# Patient Record
Sex: Male | Born: 1945 | Race: White | Hispanic: No | Marital: Married | Smoking: Never smoker
Health system: Southern US, Community
[De-identification: ages and names within clinical notes are randomized; demographics above are authoritative.]

## PROBLEM LIST (undated history)

## (undated) DIAGNOSIS — M67449 Ganglion, unspecified hand: Secondary | ICD-10-CM

## (undated) DIAGNOSIS — R519 Headache, unspecified: Secondary | ICD-10-CM

## (undated) DIAGNOSIS — I6381 Other cerebral infarction due to occlusion or stenosis of small artery: Secondary | ICD-10-CM

## (undated) DIAGNOSIS — K219 Gastro-esophageal reflux disease without esophagitis: Secondary | ICD-10-CM

## (undated) DIAGNOSIS — G2581 Restless legs syndrome: Secondary | ICD-10-CM

## (undated) DIAGNOSIS — Z8719 Personal history of other diseases of the digestive system: Secondary | ICD-10-CM

## (undated) DIAGNOSIS — I10 Essential (primary) hypertension: Secondary | ICD-10-CM

## (undated) DIAGNOSIS — E785 Hyperlipidemia, unspecified: Secondary | ICD-10-CM

## (undated) DIAGNOSIS — Z9049 Acquired absence of other specified parts of digestive tract: Secondary | ICD-10-CM

## (undated) HISTORY — DX: Other cerebral infarction due to occlusion or stenosis of small artery: I63.81

---

## 1992-07-07 HISTORY — PX: HERNIA REPAIR: SHX51

## 1997-07-07 HISTORY — PX: CHOLECYSTECTOMY: SHX55

## 1999-03-27 ENCOUNTER — Other Ambulatory Visit: Admission: RE | Admit: 1999-03-27 | Discharge: 1999-03-27 | Payer: Self-pay | Admitting: Urology

## 2002-07-28 ENCOUNTER — Ambulatory Visit (HOSPITAL_COMMUNITY): Admission: RE | Admit: 2002-07-28 | Discharge: 2002-07-28 | Payer: Self-pay | Admitting: Neurosurgery

## 2002-09-06 ENCOUNTER — Ambulatory Visit (HOSPITAL_COMMUNITY): Admission: RE | Admit: 2002-09-06 | Discharge: 2002-09-06 | Payer: Self-pay | Admitting: Neurosurgery

## 2012-03-07 HISTORY — PX: RETINAL DETACHMENT REPAIR W/ SCLERAL BUCKLE LE: SHX2338

## 2012-05-26 ENCOUNTER — Other Ambulatory Visit: Payer: Self-pay | Admitting: Orthopedic Surgery

## 2012-05-27 ENCOUNTER — Encounter (HOSPITAL_BASED_OUTPATIENT_CLINIC_OR_DEPARTMENT_OTHER): Payer: Self-pay | Admitting: *Deleted

## 2012-05-27 NOTE — Progress Notes (Signed)
NPO AFTER MN. ARRIVES AT 0830. NEEDS ISTAT AND EKG.  WILL TAKE PROTONIX AM OF SURG W/ SIP OF  WATER.

## 2012-05-30 NOTE — H&P (Signed)
Frederick Watkins is an 66 y.o. male.   Chief Complaint: L thumb mass HPI: This patient is a 66 year old RHD male who presents for evaluation of a left thumb dorsal mass that has been present since July.  In September, it was incised by a physician's assistant at his dermatologist.  It has recurred.  He has essentially no pain at the IP joint, but the bump is tender with direct pressure or if struck directly.  He reports that it did drain some clear fluid one time prior to the procedure.  Past Medical History  Diagnosis Date  . Hypertension   . Hyperlipidemia   . GERD (gastroesophageal reflux disease)   . RLS (restless legs syndrome)   . Mucous cyst of finger LEFT THUMB    Past Surgical History  Procedure Date  . Retinal detachment repair w/ scleral buckle le SEPT 2013    W/ CATARACT EXTRACTION WITH LENS IMPLANT , RIGHT EYE    History reviewed. No pertinent family history. Social History:  reports that he has never smoked. He has never used smokeless tobacco. He reports that he drinks about 4.2 ounces of alcohol per week. He reports that he does not use illicit drugs.  Allergies:  Allergies  Allergen Reactions  . Crestor (Rosuvastatin) Swelling    TONGUE SWELLS    No prescriptions prior to admission    No results found for this or any previous visit (from the past 48 hour(s)). No results found.  Review of Systems  All other systems reviewed and are negative.    Height 6' (1.829 m), weight 88.451 kg (195 lb). Physical Exam  Cardiovascular: Intact distal pulses.     Vitals: Refer to EMR. Constitutional:  WD, WN, NAD HEENT:  NCAT, EOMI Neuro/Psych:  Alert & oriented to person, place, and time; appropriate mood & affect Lymphatic: No generalized UE edema or lymphadenopathy Extremities / MSK:  Both UE are normal with respect to appearance, ranges of motion, joint stability, muscle strength/tone, sensation, & perfusion except as otherwise noted:  Left thumb has good motion  at the IP joint, with flexion to 65 with no pain.  There is mild grooving of the nail secondary to a characteristic-appearing mucous cyst on the ulnar-dorsal side of the thumb, measuring 5 mm in diameter.  The skin over the cyst is thin.  Assessment/Plan Assessment: Left thumb mucous cyst  Plan:  I discussed mucous cysts with the patient and the spectrum of treatment.  He has opted for this to be excised surgically.  I did discuss with him the concern that I have regarding how tight the closure might be given the size of the cyst and how this might require some immobilization of the thumb in the early stages of wound healing.  He would like to proceed at the outpatient surgery center after November 23.    The details of the operative procedure were discussed with the patient.  Questions were invited and answered.  In addition to the goal of the procedure, the risks of the procedure to include but not limited to bleeding; infection; damage to the nerves or blood vessels that could result in bleeding, numbness, weakness, chronic pain, and the need for additional procedures; stiffness; the need for revision surgery; and anesthetic risks, the worst of which is death, were reviewed.  No specific outcome was guaranteed or implied.  Informed consent was obtained.  Frederick Skowron A. 05/30/2012, 6:20 PM

## 2012-05-31 ENCOUNTER — Encounter (HOSPITAL_BASED_OUTPATIENT_CLINIC_OR_DEPARTMENT_OTHER): Payer: Self-pay | Admitting: Anesthesiology

## 2012-05-31 ENCOUNTER — Ambulatory Visit (HOSPITAL_BASED_OUTPATIENT_CLINIC_OR_DEPARTMENT_OTHER)
Admission: RE | Admit: 2012-05-31 | Discharge: 2012-05-31 | Disposition: A | Discharge status: Home / Self Care | Payer: Medicare PPO | Source: Ambulatory Visit | Attending: Orthopedic Surgery | Admitting: Orthopedic Surgery

## 2012-05-31 ENCOUNTER — Ambulatory Visit (HOSPITAL_BASED_OUTPATIENT_CLINIC_OR_DEPARTMENT_OTHER): Payer: Medicare PPO | Admitting: Anesthesiology

## 2012-05-31 ENCOUNTER — Encounter (HOSPITAL_BASED_OUTPATIENT_CLINIC_OR_DEPARTMENT_OTHER)
Admission: RE | Disposition: A | Discharge status: Home / Self Care | Payer: Self-pay | Source: Ambulatory Visit | Attending: Orthopedic Surgery

## 2012-05-31 ENCOUNTER — Encounter (HOSPITAL_BASED_OUTPATIENT_CLINIC_OR_DEPARTMENT_OTHER): Payer: Self-pay | Admitting: *Deleted

## 2012-05-31 DIAGNOSIS — I10 Essential (primary) hypertension: Secondary | ICD-10-CM | POA: Insufficient documentation

## 2012-05-31 DIAGNOSIS — K219 Gastro-esophageal reflux disease without esophagitis: Secondary | ICD-10-CM | POA: Insufficient documentation

## 2012-05-31 DIAGNOSIS — M6740 Ganglion, unspecified site: Secondary | ICD-10-CM | POA: Insufficient documentation

## 2012-05-31 DIAGNOSIS — E785 Hyperlipidemia, unspecified: Secondary | ICD-10-CM | POA: Insufficient documentation

## 2012-05-31 HISTORY — DX: Essential (primary) hypertension: I10

## 2012-05-31 HISTORY — PX: EAR CYST EXCISION: SHX22

## 2012-05-31 HISTORY — DX: Gastro-esophageal reflux disease without esophagitis: K21.9

## 2012-05-31 HISTORY — DX: Restless legs syndrome: G25.81

## 2012-05-31 HISTORY — DX: Hyperlipidemia, unspecified: E78.5

## 2012-05-31 HISTORY — DX: Ganglion, unspecified hand: M67.449

## 2012-05-31 LAB — POCT I-STAT 4, (NA,K, GLUC, HGB,HCT)
Glucose, Bld: 90 mg/dL (ref 70–99)
HCT: 47 % (ref 39.0–52.0)
Hemoglobin: 16 g/dL (ref 13.0–17.0)
Potassium: 4.3 mEq/L (ref 3.5–5.1)
Sodium: 139 mEq/L (ref 135–145)

## 2012-05-31 SURGERY — CYST REMOVAL
Anesthesia: General | Site: Thumb | Laterality: Left | Wound class: Clean

## 2012-05-31 MED ORDER — OXYCODONE HCL 5 MG/5ML PO SOLN
5.0000 mg | Freq: Once | ORAL | Status: DC | PRN
  Filled 2012-05-31: qty 5

## 2012-05-31 MED ORDER — MEPERIDINE HCL 25 MG/ML IJ SOLN
6.2500 mg | INTRAMUSCULAR | Status: DC | PRN
  Filled 2012-05-31: qty 1

## 2012-05-31 MED ORDER — PROMETHAZINE HCL 25 MG/ML IJ SOLN
6.2500 mg | INTRAMUSCULAR | Status: DC | PRN
  Filled 2012-05-31: qty 1

## 2012-05-31 MED ORDER — OXYCODONE-ACETAMINOPHEN 5-325 MG PO TABS
1.0000 | ORAL_TABLET | ORAL | Status: DC | PRN
  Filled 2012-05-31: qty 2

## 2012-05-31 MED ORDER — CEFAZOLIN SODIUM-DEXTROSE 2-3 GM-% IV SOLR
INTRAVENOUS | Status: DC | PRN
  Administered 2012-05-31: 2 g via INTRAVENOUS

## 2012-05-31 MED ORDER — BUPIVACAINE-EPINEPHRINE 0.5% -1:200000 IJ SOLN
INTRAMUSCULAR | Status: DC | PRN
  Administered 2012-05-31: 4 mL

## 2012-05-31 MED ORDER — LACTATED RINGERS IV SOLN
INTRAVENOUS | Status: DC
  Administered 2012-05-31: 10:00:00 via INTRAVENOUS
  Filled 2012-05-31: qty 1000

## 2012-05-31 MED ORDER — ONDANSETRON HCL 4 MG/2ML IJ SOLN
INTRAMUSCULAR | Status: DC | PRN
  Administered 2012-05-31: 4 mg via INTRAVENOUS

## 2012-05-31 MED ORDER — HYDROCODONE-ACETAMINOPHEN 5-325 MG PO TABS
1.0000 | ORAL_TABLET | ORAL | Status: DC | PRN
  Filled 2012-05-31: qty 2

## 2012-05-31 MED ORDER — LACTATED RINGERS IV SOLN
INTRAVENOUS | Status: DC | PRN
  Administered 2012-05-31: 10:00:00 via INTRAVENOUS

## 2012-05-31 MED ORDER — HYDROMORPHONE HCL PF 1 MG/ML IJ SOLN
0.5000 mg | INTRAMUSCULAR | Status: DC | PRN
  Filled 2012-05-31: qty 1

## 2012-05-31 MED ORDER — ACETAMINOPHEN 10 MG/ML IV SOLN
1000.0000 mg | Freq: Once | INTRAVENOUS | Status: DC | PRN
  Filled 2012-05-31: qty 100

## 2012-05-31 MED ORDER — HYDROMORPHONE HCL PF 1 MG/ML IJ SOLN
0.2500 mg | INTRAMUSCULAR | Status: DC | PRN
  Filled 2012-05-31: qty 1

## 2012-05-31 MED ORDER — FENTANYL CITRATE 0.05 MG/ML IJ SOLN
INTRAMUSCULAR | Status: DC | PRN
  Administered 2012-05-31 (×2): 25 ug via INTRAVENOUS

## 2012-05-31 MED ORDER — CEFAZOLIN SODIUM-DEXTROSE 2-3 GM-% IV SOLR
2.0000 g | INTRAVENOUS | Status: DC
  Filled 2012-05-31: qty 50

## 2012-05-31 MED ORDER — LACTATED RINGERS IV SOLN
INTRAVENOUS | Status: DC
  Filled 2012-05-31: qty 1000

## 2012-05-31 MED ORDER — OXYCODONE HCL 5 MG PO TABS
5.0000 mg | ORAL_TABLET | Freq: Once | ORAL | Status: DC | PRN
  Filled 2012-05-31: qty 1

## 2012-05-31 MED ORDER — METOCLOPRAMIDE HCL 5 MG/ML IJ SOLN
INTRAMUSCULAR | Status: DC | PRN
  Administered 2012-05-31: 10 mg via INTRAVENOUS

## 2012-05-31 MED ORDER — MIDAZOLAM HCL 5 MG/5ML IJ SOLN
INTRAMUSCULAR | Status: DC | PRN
  Administered 2012-05-31: 2 mg via INTRAVENOUS
  Administered 2012-05-31 (×2): 1 mg via INTRAVENOUS

## 2012-05-31 MED ORDER — CHLORHEXIDINE GLUCONATE 4 % EX LIQD
60.0000 mL | Freq: Once | CUTANEOUS | Status: DC
  Filled 2012-05-31: qty 60

## 2012-05-31 MED ORDER — LIDOCAINE HCL 2 % IJ SOLN
INTRAMUSCULAR | Status: DC | PRN
  Administered 2012-05-31: 4 mL

## 2012-05-31 MED ORDER — KETOROLAC TROMETHAMINE 30 MG/ML IJ SOLN
INTRAMUSCULAR | Status: DC | PRN
  Administered 2012-05-31: 30 mg via INTRAVENOUS

## 2012-05-31 SURGICAL SUPPLY — 48 items
BANDAGE CONFORM 2  STR LF (GAUZE/BANDAGES/DRESSINGS) IMPLANT
BANDAGE CONFORM 3  STR LF (GAUZE/BANDAGES/DRESSINGS) IMPLANT
BANDAGE GAUZE ELAST BULKY 4 IN (GAUZE/BANDAGES/DRESSINGS) IMPLANT
BLADE MINI RND TIP GREEN BEAV (BLADE) IMPLANT
BLADE SURG 15 STRL LF DISP TIS (BLADE) ×1 IMPLANT
BLADE SURG 15 STRL SS (BLADE) ×1
BNDG COHESIVE 1X5 TAN STRL LF (GAUZE/BANDAGES/DRESSINGS) ×2 IMPLANT
BNDG COHESIVE 4X5 TAN NS LF (GAUZE/BANDAGES/DRESSINGS) ×2 IMPLANT
BNDG ESMARK 4X9 LF (GAUZE/BANDAGES/DRESSINGS) IMPLANT
CANISTER SUCTION 1200CC (MISCELLANEOUS) IMPLANT
CANISTER SUCTION 2500CC (MISCELLANEOUS) IMPLANT
CHLORAPREP W/TINT 26ML (MISCELLANEOUS) ×2 IMPLANT
CLOTH BEACON ORANGE TIMEOUT ST (SAFETY) ×2 IMPLANT
CORDS BIPOLAR (ELECTRODE) ×2 IMPLANT
COVER MAYO STAND STRL (DRAPES) ×4 IMPLANT
COVER TABLE BACK 60X90 (DRAPES) ×2 IMPLANT
DEPRESSOR TONGUE BLADE STERILE (MISCELLANEOUS) ×2 IMPLANT
DRAPE EXTREMITY T 121X128X90 (DRAPE) ×2 IMPLANT
DRAPE SURG 17X23 STRL (DRAPES) ×2 IMPLANT
DRSG EMULSION OIL 3X3 NADH (GAUZE/BANDAGES/DRESSINGS) ×2 IMPLANT
ELECT NEEDLE BLADE 2-5/6 (NEEDLE) IMPLANT
GLOVE BIO SURGEON STRL SZ7.5 (GLOVE) ×2 IMPLANT
GLOVE INDICATOR 8.0 STRL GRN (GLOVE) ×4 IMPLANT
GOWN PREVENTION PLUS LG XLONG (DISPOSABLE) ×2 IMPLANT
GOWN PREVENTION PLUS XLARGE (GOWN DISPOSABLE) ×2 IMPLANT
NEEDLE HYPO 22GX1.5 SAFETY (NEEDLE) IMPLANT
NS IRRIG 500ML POUR BTL (IV SOLUTION) IMPLANT
PACK BASIN DAY SURGERY FS (CUSTOM PROCEDURE TRAY) ×2 IMPLANT
PADDING CAST ABS 3INX4YD NS (CAST SUPPLIES) ×1
PADDING CAST ABS 4INX4YD NS (CAST SUPPLIES) ×2
PADDING CAST ABS COTTON 3X4 (CAST SUPPLIES) ×1 IMPLANT
PADDING CAST ABS COTTON 4X4 ST (CAST SUPPLIES) ×2 IMPLANT
SLEEVE SCD COMPRESS KNEE MED (MISCELLANEOUS) IMPLANT
SLING ARM FOAM STRAP LRG (SOFTGOODS) IMPLANT
SLING ARM FOAM STRAP MED (SOFTGOODS) IMPLANT
SPLINT PLASTER CAST XFAST 3X15 (CAST SUPPLIES) IMPLANT
SPLINT PLASTER CAST XFAST 4X15 (CAST SUPPLIES) IMPLANT
SPLINT PLASTER XTRA FAST SET 4 (CAST SUPPLIES)
SPLINT PLASTER XTRA FASTSET 3X (CAST SUPPLIES)
SPONGE GAUZE 4X4 12PLY (GAUZE/BANDAGES/DRESSINGS) ×2 IMPLANT
SUT CHROMIC 6 0 PS 4 (SUTURE) IMPLANT
SUT ETHILON 4 0 PS 2 18 (SUTURE) IMPLANT
SUT VICRYL RAPID 5 0 P 3 (SUTURE) IMPLANT
SUT VICRYL RAPIDE 4/0 PS 2 (SUTURE) ×2 IMPLANT
SYRINGE 10CC LL (SYRINGE) ×2 IMPLANT
TUBE CONNECTING 12X1/4 (SUCTIONS) IMPLANT
UNDERPAD 30X30 INCONTINENT (UNDERPADS AND DIAPERS) ×2 IMPLANT
WATER STERILE IRR 500ML POUR (IV SOLUTION) ×2 IMPLANT

## 2012-05-31 NOTE — Anesthesia Procedure Notes (Signed)
Procedure Name: MAC Date/Time: 05/31/2012 10:17 AM Performed by: Jessica Priest Pre-anesthesia Checklist: Patient identified, Emergency Drugs available, Suction available, Patient being monitored and Timeout performed Patient Re-evaluated:Patient Re-evaluated prior to inductionOxygen Delivery Method: Nasal cannula Preoxygenation: Pre-oxygenation with 100% oxygen Comments: Nasal cannula O 2 on

## 2012-05-31 NOTE — Anesthesia Postprocedure Evaluation (Deleted)
Anesthesia Post Note  Patient: Frederick Watkins  Procedure(s) Performed: Procedure(s) (LRB): CYST REMOVAL (Left)  Anesthesia type: General  Patient location: PACU  Post pain: Pain level controlled  Post assessment: Post-op Vital signs reviewed  Last Vitals: BP 111/70  Pulse 66  Temp 36 C (Oral)  Resp 16  Ht 6' (1.829 m)  Wt 205 lb (92.987 kg)  BMI 27.80 kg/m2  SpO2 94%  Post vital signs: Reviewed  Level of consciousness: sedated  Complications: No apparent anesthesia complications

## 2012-05-31 NOTE — Transfer of Care (Signed)
Immediate Anesthesia Transfer of Care Note  Patient: Frederick Watkins  Procedure(s) Performed: Procedure(s) (LRB): CYST REMOVAL (Left)  Patient Location: PACU  Anesthesia Type: MAC  Level of Consciousness: awake, sedated, patient cooperative and responds to stimulation  Airway & Oxygen Therapy: Patient Spontanous Breathing and Patient RA no O2,  alert Post-op Assessment: Report given to PACU RN, Post -op Vital signs reviewed and stable and Patient moving all extremities  Post vital signs: Reviewed and stable  Complications: No apparent anesthesia complications

## 2012-05-31 NOTE — Anesthesia Postprocedure Evaluation (Signed)
Anesthesia Post Note  Patient: Frederick Watkins  Procedure(s) Performed: Procedure(s) (LRB): CYST REMOVAL (Left)  Anesthesia type: MAC  Patient location: PACU  Post pain: Pain level controlled  Post assessment: Post-op Vital signs reviewed  Last Vitals: BP 111/70  Pulse 66  Temp 36 C (Oral)  Resp 16  Ht 6' (1.829 m)  Wt 205 lb (92.987 kg)  BMI 27.80 kg/m2  SpO2 94%  Post vital signs: Reviewed  Level of consciousness: awake  Complications: No apparent anesthesia complications

## 2012-05-31 NOTE — Op Note (Signed)
05/31/2012  10:53 AM  PATIENT:  Frederick Watkins  66 y.o. male  PRE-OPERATIVE DIAGNOSIS:  Left thumb mucous cyst  POST-OPERATIVE DIAGNOSIS:  Same  PROCEDURE:  Excision left thumb mucous cyst  SURGEON: Cliffton Asters. Janee Morn, MD  PHYSICIAN ASSISTANT: None  ANESTHESIA:  Digital block/MAC  SPECIMENS:  To pathology  DRAINS:   None  PREOPERATIVE INDICATIONS:  Frederick Watkins is a  66 y.o. male with a diagnosis of left thumb mucous cyst who failed conservative measures and elected for surgical management.    The risks benefits and alternatives were discussed with the patient preoperatively including but not limited to the risks of infection, bleeding, nerve injury, cardiopulmonary complications, the need for revision surgery, among others, and the patient verbalized understanding and consented to proceed.  OPERATIVE IMPLANTS: None  OPERATIVE FINDINGS: Cystic structure with thin skin coverage overlying the terminal extensor tendon, no apparent connection to DIP joint.  OPERATIVE PROCEDURE:  After receiving prophylactic antibiotics, the patient was escorted to the operative theatre and placed in a supine position.  Digital block to the thumb was performed using Marcaine with epinephrine mixed with lidocaine. A surgical "time-out" was performed during which the planned procedure, proposed operative site, and the correct patient identity were compared to the operative consent and agreement confirmed by the circulating nurse according to current facility policy.  Following application of a tourniquet to the operative extremity, the exposed skin was prepped with Chloraprep and draped in the usual sterile fashion.  The limb was exsanguinated with an Esmarch bandage and the tourniquet inflated to approximately higher than systolic BP.  A Penrose drain was applied to the base of the thumb and clinical hemostat views of a tourniquet, and the pneumatic tourniquet was deflated  The lesion was marked  elliptically and excised using a Beaver blade. He did not appear to have an origin deep to the extensor tendon. The nail fold appeared not to be violated. Tourniquet was released, additional hemostasis obtained with bipolar cautery and the skin was closed with interrupted 4-0 nylon sutures. A dressing was applied with a tongue blade for a dorsal splint keeping the thumb in extension. He was taken to the recovery room in stable condition.    Disposition: Patient discharged home today with typical postop instructions, returning in 10-15 days for reassessment.  The sutures can be removed if the wound appears appropriate

## 2012-05-31 NOTE — Interval H&P Note (Signed)
History and Physical Interval Note:  05/31/2012 9:57 AM  Frederick Watkins  has presented today for surgery, with the diagnosis of left thumb mucous cyst  The various methods of treatment have been discussed with the patient and family. After consideration of risks, benefits and other options for treatment, the patient has consented to  Procedure(s) (LRB) with comments: CYST REMOVAL (Left) - Left Thumb Mucous Cyst Excision as a surgical intervention .  The patient's history has been reviewed, patient examined, no change in status, stable for surgery.  I have reviewed the patient's chart and labs.  Questions were answered to the patient's satisfaction.     Johnwilliam Shepperson A.

## 2012-05-31 NOTE — H&P (View-Only) (Signed)
NPO AFTER MN. ARRIVES AT 0830. NEEDS ISTAT AND EKG.  WILL TAKE PROTONIX AM OF SURG W/ SIP OF  WATER. 

## 2012-05-31 NOTE — Anesthesia Preprocedure Evaluation (Addendum)
Anesthesia Evaluation  Patient identified by MRN, date of birth, ID band Patient awake    Reviewed: Allergy & Precautions, H&P , NPO status , Patient's Chart, lab work & pertinent test results  Airway Mallampati: II TM Distance: >3 FB Neck ROM: Full    Dental  (+) Dental Advisory Given and Partial Upper   Pulmonary neg pulmonary ROS,  breath sounds clear to auscultation  Pulmonary exam normal       Cardiovascular hypertension, Pt. on medications Rhythm:Regular Rate:Normal     Neuro/Psych negative neurological ROS  negative psych ROS   GI/Hepatic negative GI ROS, Neg liver ROS, GERD-  Medicated,  Endo/Other  negative endocrine ROS  Renal/GU      Musculoskeletal negative musculoskeletal ROS (+)   Abdominal   Peds  Hematology negative hematology ROS (+)   Anesthesia Other Findings   Reproductive/Obstetrics                          Anesthesia Physical Anesthesia Plan  ASA: II  Anesthesia Plan: MAC   Post-op Pain Management:    Induction: Intravenous  Airway Management Planned: Nasal Cannula  Additional Equipment:   Intra-op Plan:   Post-operative Plan:   Informed Consent: I have reviewed the patients History and Physical, chart, labs and discussed the procedure including the risks, benefits and alternatives for the proposed anesthesia with the patient or authorized representative who has indicated his/her understanding and acceptance.   Dental advisory given  Plan Discussed with: CRNA  Anesthesia Plan Comments: (Pt had black coffee about 7am today. Discussed with Dr. Janee Morn options for anesthesia. Plan local with tourniquet. Pt accepts and will proceed.)       Anesthesia Quick Evaluation

## 2012-06-01 ENCOUNTER — Encounter (HOSPITAL_BASED_OUTPATIENT_CLINIC_OR_DEPARTMENT_OTHER): Payer: Self-pay | Admitting: Orthopedic Surgery

## 2013-09-04 ENCOUNTER — Emergency Department (HOSPITAL_COMMUNITY): Payer: Medicare Other

## 2013-09-04 ENCOUNTER — Emergency Department (HOSPITAL_COMMUNITY)
Admission: EM | Admit: 2013-09-04 | Discharge: 2013-09-04 | Disposition: A | Discharge status: Home / Self Care | Payer: Medicare Other | Attending: Emergency Medicine | Admitting: Emergency Medicine

## 2013-09-04 ENCOUNTER — Encounter (HOSPITAL_COMMUNITY): Payer: Self-pay | Admitting: Emergency Medicine

## 2013-09-04 DIAGNOSIS — Z79899 Other long term (current) drug therapy: Secondary | ICD-10-CM | POA: Insufficient documentation

## 2013-09-04 DIAGNOSIS — S2232XA Fracture of one rib, left side, initial encounter for closed fracture: Secondary | ICD-10-CM

## 2013-09-04 DIAGNOSIS — I10 Essential (primary) hypertension: Secondary | ICD-10-CM | POA: Insufficient documentation

## 2013-09-04 DIAGNOSIS — R296 Repeated falls: Secondary | ICD-10-CM | POA: Insufficient documentation

## 2013-09-04 DIAGNOSIS — Y939 Activity, unspecified: Secondary | ICD-10-CM | POA: Insufficient documentation

## 2013-09-04 DIAGNOSIS — R55 Syncope and collapse: Secondary | ICD-10-CM

## 2013-09-04 DIAGNOSIS — Z872 Personal history of diseases of the skin and subcutaneous tissue: Secondary | ICD-10-CM | POA: Insufficient documentation

## 2013-09-04 DIAGNOSIS — R112 Nausea with vomiting, unspecified: Secondary | ICD-10-CM | POA: Insufficient documentation

## 2013-09-04 DIAGNOSIS — S01501A Unspecified open wound of lip, initial encounter: Secondary | ICD-10-CM | POA: Insufficient documentation

## 2013-09-04 DIAGNOSIS — S2249XA Multiple fractures of ribs, unspecified side, initial encounter for closed fracture: Secondary | ICD-10-CM | POA: Insufficient documentation

## 2013-09-04 DIAGNOSIS — Y929 Unspecified place or not applicable: Secondary | ICD-10-CM | POA: Insufficient documentation

## 2013-09-04 DIAGNOSIS — Z7982 Long term (current) use of aspirin: Secondary | ICD-10-CM | POA: Insufficient documentation

## 2013-09-04 DIAGNOSIS — G2581 Restless legs syndrome: Secondary | ICD-10-CM | POA: Insufficient documentation

## 2013-09-04 DIAGNOSIS — S022XXA Fracture of nasal bones, initial encounter for closed fracture: Secondary | ICD-10-CM

## 2013-09-04 DIAGNOSIS — E785 Hyperlipidemia, unspecified: Secondary | ICD-10-CM | POA: Insufficient documentation

## 2013-09-04 DIAGNOSIS — Z8719 Personal history of other diseases of the digestive system: Secondary | ICD-10-CM | POA: Insufficient documentation

## 2013-09-04 DIAGNOSIS — Z23 Encounter for immunization: Secondary | ICD-10-CM | POA: Insufficient documentation

## 2013-09-04 LAB — CBC WITH DIFFERENTIAL/PLATELET
BASOS ABS: 0 10*3/uL (ref 0.0–0.1)
Basophils Relative: 0 % (ref 0–1)
EOS ABS: 0.1 10*3/uL (ref 0.0–0.7)
EOS PCT: 0 % (ref 0–5)
HCT: 44.3 % (ref 39.0–52.0)
Hemoglobin: 15.2 g/dL (ref 13.0–17.0)
Lymphocytes Relative: 13 % (ref 12–46)
Lymphs Abs: 1.8 10*3/uL (ref 0.7–4.0)
MCH: 30.5 pg (ref 26.0–34.0)
MCHC: 34.3 g/dL (ref 30.0–36.0)
MCV: 89 fL (ref 78.0–100.0)
MONO ABS: 1 10*3/uL (ref 0.1–1.0)
Monocytes Relative: 7 % (ref 3–12)
Neutro Abs: 11.1 10*3/uL — ABNORMAL HIGH (ref 1.7–7.7)
Neutrophils Relative %: 80 % — ABNORMAL HIGH (ref 43–77)
PLATELETS: 227 10*3/uL (ref 150–400)
RBC: 4.98 MIL/uL (ref 4.22–5.81)
RDW: 14.3 % (ref 11.5–15.5)
WBC: 14 10*3/uL — AB (ref 4.0–10.5)

## 2013-09-04 LAB — I-STAT TROPONIN, ED: TROPONIN I, POC: 0.01 ng/mL (ref 0.00–0.08)

## 2013-09-04 LAB — BASIC METABOLIC PANEL
BUN: 13 mg/dL (ref 6–23)
CALCIUM: 8.9 mg/dL (ref 8.4–10.5)
CO2: 26 mEq/L (ref 19–32)
CREATININE: 0.9 mg/dL (ref 0.50–1.35)
Chloride: 99 mEq/L (ref 96–112)
GFR calc Af Amer: >90 mL/min (ref >90)
GFR, EST NON AFRICAN AMERICAN: 85 mL/min — AB (ref >90)
Glucose, Bld: 144 mg/dL — ABNORMAL HIGH (ref 70–99)
Potassium: 4.3 mEq/L (ref 3.7–5.3)
SODIUM: 139 meq/L (ref 137–147)

## 2013-09-04 MED ORDER — OXYCODONE-ACETAMINOPHEN 5-325 MG PO TABS: 1.0000 | ORAL_TABLET | ORAL | Status: DC | PRN

## 2013-09-04 MED ORDER — FENTANYL CITRATE 0.05 MG/ML IJ SOLN
100.0000 ug | Freq: Once | INTRAMUSCULAR | Status: AC
  Administered 2013-09-04: 100 ug via INTRAVENOUS
  Filled 2013-09-04: qty 2

## 2013-09-04 MED ORDER — SODIUM CHLORIDE 0.9 % IV BOLUS (SEPSIS)
1000.0000 mL | Freq: Once | INTRAVENOUS | Status: AC
  Administered 2013-09-04: 1000 mL via INTRAVENOUS

## 2013-09-04 MED ORDER — FENTANYL CITRATE 0.05 MG/ML IJ SOLN
50.0000 ug | Freq: Once | INTRAMUSCULAR | Status: AC
  Administered 2013-09-04: 50 ug via INTRAVENOUS
  Filled 2013-09-04: qty 2

## 2013-09-04 MED ORDER — TETANUS-DIPHTH-ACELL PERTUSSIS 5-2.5-18.5 LF-MCG/0.5 IM SUSP
0.5000 mL | Freq: Once | INTRAMUSCULAR | Status: AC
  Administered 2013-09-04: 0.5 mL via INTRAMUSCULAR
  Filled 2013-09-04: qty 0.5

## 2013-09-04 NOTE — ED Notes (Signed)
Per EMS, patient was feeling nauseous and hot that woke him up from his sleep.  He got up to go to the bathroom, was dizzy and fell.  He hit his face on the commode with lacerations noted on right side of lip and inside upper lip.  He also complains of left flank pain on palpation.  Patient received 4mg  of zofran via EMS, and 13mL of normal saline through 20g in right A/C.  Nausea has subsided.  Blood pressure per EMS 136/76, heart rate of 68, RR 14.  Flank pain 7/10, and face pain 5/10.  Patient is from home.

## 2013-09-04 NOTE — ED Notes (Signed)
Patient transported to X-ray 

## 2013-09-04 NOTE — ED Notes (Signed)
Reported orthostatic findings to Dr. Christy Gentles.

## 2013-09-04 NOTE — ED Notes (Signed)
orthosatatic with standing pt.was very dizzie. Pt.has fall risk band is on right

## 2013-09-04 NOTE — ED Notes (Signed)
Patient returned to room. 

## 2013-09-04 NOTE — ED Notes (Signed)
Wickline MD at bedside  

## 2013-09-04 NOTE — ED Notes (Signed)
Respiratory therapist at the bedside teaching use of incentive spirometer.

## 2013-09-04 NOTE — ED Notes (Signed)
RT notified for incentive spirometer.  

## 2013-09-04 NOTE — ED Notes (Signed)
Patient discharged to home with wife. NAD.

## 2013-09-04 NOTE — ED Notes (Signed)
Suture cart is at the bedside.  

## 2013-09-04 NOTE — ED Provider Notes (Signed)
CSN: [id number]     Arrival date & time 09/04/13  0258 History   First MD Initiated Contact with Patient 09/04/13 [phone]     Chief Complaint  Patient presents with  . Fall      Patient is a 68 y.o. male presenting with syncope. The history is provided by the patient and the spouse.  Loss of Consciousness Episode history:  Single Most recent episode:  Today Duration:  1 minute Timing:  Constant Progression:  Improving Chronicity:  New Context: standing up   Witnessed: yes   Relieved by: lying down. Worsened by:  Posture Associated symptoms: vomiting   Associated symptoms: no chest pain, no confusion, no difficulty breathing, no fever, no focal weakness, no headaches, no seizures, no shortness of breath and no weakness   Pt reports he woke up to use restroom when he felt "warm" and dizzy.  He called out for wife who found him standing but was not speaking.  He then fell to floor.  No seizure reported.  Brief LOC reported.  He was back to baseline within one minute.  No proceeding cp/sob.  No HA  He sustained nasal/facial injury from fall.  He also injured his left chest wall He vomited once after the episode  No neck or back pain  He had otherwise been well but did not eat well prior to bed.  He is also on medications that can cause dizziness.    He denies h/o CAD/CHF   Past Medical History  Diagnosis Date  . Hypertension   . Hyperlipidemia   . GERD (gastroesophageal reflux disease)   . RLS (restless legs syndrome)   . Mucous cyst of finger LEFT THUMB   Past Surgical History  Procedure Laterality Date  . Retinal detachment repair w/ scleral buckle le  SEPT 2013    W/ CATARACT EXTRACTION WITH LENS IMPLANT , RIGHT EYE  . Ear cyst excision  05/31/2012    Procedure: CYST REMOVAL;  Surgeon: [name], MD;  Location: [facility];  Service: Orthopedics;  Laterality: Left;  Left Thumb Mucous Cyst Excision   History reviewed. No pertinent family  history. History  Substance Use Topics  . Smoking status: Never Smoker   . Smokeless tobacco: Never Used  . Alcohol Use: 4.2 oz/week    7 Glasses of wine per week    Review of Systems  Constitutional: Negative for fever.  Respiratory: Negative for shortness of breath.   Cardiovascular: Positive for syncope. Negative for chest pain.  Gastrointestinal: Positive for vomiting. Negative for abdominal pain.  Musculoskeletal: Negative for back pain and neck pain.  Neurological: Positive for syncope. Negative for focal weakness, seizures, weakness and headaches.  Psychiatric/Behavioral: Negative for confusion.  All other systems reviewed and are negative.      Allergies  Crestor  Home Medications   Current Outpatient Rx  Name  Route  Sig  Dispense  Refill  . aspirin EC 325 MG tablet   Oral   Take 325 mg by mouth daily.         . citalopram (CELEXA) 20 MG tablet   Oral   Take 20 mg by mouth every evening.         Marland Kitchen lisinopril (PRINIVIL,ZESTRIL) 20 MG tablet   Oral   Take 20 mg by mouth every evening.         Marland Kitchen LORazepam (ATIVAN) 0.5 MG tablet   Oral   Take 0.5 mg by mouth every 8 (eight) hours as  needed.         . Omega-3 Fatty Acids (FISH OIL) 1000 MG CAPS   Oral   Take 1 capsule by mouth daily.         Marland Kitchen rOPINIRole (REQUIP) 1 MG tablet   Oral   Take 1 mg by mouth at bedtime.         . simvastatin (ZOCOR) 40 MG tablet   Oral   Take 40 mg by mouth every evening.          BP 109/66  Pulse 70  Temp(Src) 97.9 F (36.6 C)  Resp 15  Wt 205 lb (92.987 kg)  SpO2 98% BP 113/67  Pulse 74  Temp(Src) 97.9 F (36.6 C)  Resp 14  Wt 205 lb (92.987 kg)  SpO2 99%  Physical Exam CONSTITUTIONAL: Well developed/well nourished HEAD: Normocephalic/atraumatic EYES: EOMI/PERRL ENMT: Mucous membranes moist. Nasal tenderness.  No septal hematoma.  Blood noted at nares.  Laceration above right lip.  Small laceration noted to inner mucosa.  No dental injury.   No malocclusion.  NECK: supple no meningeal signs SPINE:entire spine nontender, No bruising/crepitance/stepoffs noted to spine NEXUS criteria met CV: S1/S2 noted, no murmurs/rubs/gallops noted LUNGS: Lungs are clear to auscultation bilaterally, no apparent distress Chest - tenderness along left costal margin.  No crepitus is noted ABDOMEN: soft, nontender, no rebound or guarding, no bruising is noted GU:no cva tenderness, no bruising is noted NEURO: Pt is awake/alert, moves all extremitiesx4, no arm/leg drift is noted, no facial droop is noted, GCS 15 EXTREMITIES: pulses normal, full ROM, All extremities/joints palpated/ranged and nontender SKIN: warm, color normal PSYCH: no abnormalities of mood noted  ED Course  Procedures   LACERATION REPAIR Performed by: Sharyon Cable Consent: Verbal consent obtained. Risks and benefits: risks, benefits and alternatives were discussed Patient identity confirmed: provided demographic data Time out performed prior to procedure Prepped and Draped in normal sterile fashion Wound explored Laceration Location:just superior to right lip Laceration Length: 1cm No Foreign Bodies seen or palpated Anesthesia: local infiltration Local anesthetic: lidocaine 1%  epinephrine Anesthetic total: 3 ml Amount of cleaning: standard Skin closure: simple Number of sutures or staples: 2 vicryl Technique: simple Patient tolerance: Patient tolerated the procedure well with no immediate complications.  5:30 AM Pt with syncopal episode after standing.  He has positive orthostatic vital signs here.  Suspect this could have been caused by position change, decreased PO and also his medications Pt currently stable 6:43 AM D/w ENT dr byers, no antibiotics for nasal fx, f/u in 5 days Pt feels improved, ambulatory and no dizziness reported  He would like to go home   Pt still insisting on d/c home He is not hypoxic or tachypneic His CP is controlled  For  syncope - suspect vasovagal episode.  I doubt this represents cardiac dysrhythmia or acute neurologic emergency. Pt is now improved and ambulatory and he responded to IV fluids  For nasal fx- will f/u with ENT (he has seen [facility]) in 4 days.    For rib fractures - I advised liberal use of pain meds, incentive spirometer.  I offered admission for pain control but he wants to go home.  I advised close PCP followup in 1-2 days for recheck. We discussed strict precautions including SOB/hemoptysis/increased chest pain  Definitive fracture care performed for the left rib fractures  This included analgesia in the ED, incentive spiromoterly,  outpatient prescriptions for ongoing pain control as outpatient which have been provided.  I have counseled  the pt on possible complications of the fractures and signs and symptoms which would mandate return for further care.  They have expressed their understanding.   Labs Review Labs Reviewed  CBC WITH DIFFERENTIAL - Abnormal; Notable for the following:    WBC 14.0 (*)    Neutrophils Relative % 80 (*)    Neutro Abs 11.1 (*)    All other components within normal limits  BASIC METABOLIC PANEL - Abnormal; Notable for the following:    Glucose, Bld 144 (*)    GFR calc non Af Amer 85 (*)    All other components within normal limits  I-STAT TROPOININ, ED   Imaging Review Dg Ribs Unilateral W/chest Left  09/04/2013   CLINICAL DATA:  Pain, dizziness, fall with left anterior rib pain.  EXAM: LEFT RIBS AND CHEST - 3+ VIEW  COMPARISON:  None available for comparison at time of study interpretation.  FINDINGS: Left anterolateral sixth through rib fractures are slightly displaced. Left posterior seventh, eighth and possibly ninth displaced rib fractures.  Strandy densities in left lung base with trace left pleural effusion. No pneumothorax. The right lung is clear. Cardio mediastinal silhouette is nonsuspicious for this low inspiratory examination. Surgical clips in  the right abdomen may reflect cholecystectomy.  IMPRESSION: Minimally displaced collapse anterior lateral sixth through eighth rib fractures. Left posterior seventh, eighth and possibly ninth minimally displaced rib fractures.  Strandy densities in left lung base may reflect atelectasis or even pulmonary contusion with small left pleural effusion versus hemothorax, no pneumothorax.   Electronically Signed   By: Elon Alas   On: 09/04/2013 05:28   Ct Head Wo Contrast  09/04/2013   CLINICAL DATA:  Status post fall; hit face on commode. Lacerations at the lips and dried blood about the nares. Nausea.  EXAM: CT HEAD WITHOUT CONTRAST  CT MAXILLOFACIAL WITHOUT CONTRAST  TECHNIQUE: Multidetector CT imaging of the head and maxillofacial structures were performed using the standard protocol without intravenous contrast. Multiplanar CT image reconstructions of the maxillofacial structures were also generated.  COMPARISON:  None.  FINDINGS: CT HEAD FINDINGS  There is no evidence of acute infarction, mass lesion, or intra- or extra-axial hemorrhage on CT.  Prominence of the sulci suggests mild cortical volume loss. 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 Mild cerebellar atrophy is noted.  The brainstem and fourth ventricle are within normal limits. The cerebral hemispheres demonstrate grossly normal gray-white differentiation. No mass effect or midline shift is seen.  There is a significantly comminuted fracture involving both sides of the nasal bone, with leftward displacement. A superior fragment demonstrates mild superior displacement. Scattered associated foci of soft tissue air are seen, with soft tissue swelling. The visualized portions of the orbits are within normal limits. The paranasal sinuses and mastoid air cells are well-aerated. No additional soft tissue abnormalities are seen.  CT MAXILLOFACIAL FINDINGS  There is significantly comminuted fracture involving both sides of the nasal  bone, with leftward displacement. A superior fragment demonstrates mild superior displacement. No additional fractures are seen. The maxilla and mandible appear intact. The visualized dentition demonstrates no acute abnormality.  The orbits are intact bilaterally. The visualized paranasal sinuses and mastoid air cells are well-aerated.  Soft tissue swelling is noted about the nose; known soft tissue lacerations at the lips are not well characterized on CT. The parapharyngeal fat planes are preserved. The nasopharynx, oropharynx and hypopharynx are unremarkable in appearance. The visualized portions of the valleculae and piriform sinuses are  grossly unremarkable.  The parotid and submandibular glands are within normal limits. No cervical lymphadenopathy is seen.  IMPRESSION: 1. No evidence of traumatic intracranial injury. 2. Significantly comminuted fracture involving both sides of the nasal bone, with leftward displacement. A superior fragment of the nasal bone demonstrates mild superior displacement. Associated soft tissue swelling and scattered foci of soft tissue air seen. 3. 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   Electronically Signed   By: Garald Balding M.D.   On: 09/04/2013 05:46   Ct Maxillofacial Wo Cm  09/04/2013   CLINICAL DATA:  Status post fall; hit face on commode. Lacerations at the lips and dried blood about the nares. Nausea.  EXAM: CT HEAD WITHOUT CONTRAST  CT MAXILLOFACIAL WITHOUT CONTRAST  TECHNIQUE: Multidetector CT imaging of the head and maxillofacial structures were performed using the standard protocol without intravenous contrast. Multiplanar CT image reconstructions of the maxillofacial structures were also generated.  COMPARISON:  None.  FINDINGS: CT HEAD FINDINGS  There is no evidence of acute infarction, mass lesion, or intra- or extra-axial hemorrhage on CT.  Prominence of the sulci suggests mild cortical volume loss. A chronic lacunar  infarct is noted at the left basal ganglia. Mild cerebellar atrophy is noted.  The brainstem and fourth ventricle are within normal limits. The cerebral hemispheres demonstrate grossly normal gray-white differentiation. No mass effect or midline shift is seen.  There is a significantly comminuted fracture involving both sides of the nasal bone, with leftward displacement. A superior fragment demonstrates mild superior displacement. Scattered associated foci of soft tissue air are seen, with soft tissue swelling. The visualized portions of the orbits are within normal limits. The paranasal sinuses and mastoid air cells are well-aerated. No additional soft tissue abnormalities are seen.  CT MAXILLOFACIAL FINDINGS  There is significantly comminuted fracture involving both sides of the nasal bone, with leftward displacement. A superior fragment demonstrates mild superior displacement. No additional fractures are seen. The maxilla and mandible appear intact. The visualized dentition demonstrates no acute abnormality.  The orbits are intact bilaterally. The visualized paranasal sinuses and mastoid air cells are well-aerated.  Soft tissue swelling is noted about the nose; known soft tissue lacerations at the lips are not well characterized on CT. The parapharyngeal fat planes are preserved. The nasopharynx, oropharynx and hypopharynx are unremarkable in appearance. The visualized portions of the valleculae and piriform sinuses are grossly unremarkable.  The parotid and submandibular glands are within normal limits. No cervical lymphadenopathy is seen.  IMPRESSION: 1. No evidence of traumatic intracranial injury. 2. Significantly comminuted fracture involving both sides of the nasal bone, with leftward displacement. A superior fragment of the nasal bone demonstrates mild superior displacement. Associated soft tissue swelling and scattered foci of soft tissue air seen. 3. Mild cortical volume loss; chronic lacunar infarct at  the left basal ganglia.   Electronically Signed   By: Garald Balding M.D.   On: 09/04/2013 05:46     EKG Interpretation   Date/Time:  Sunday September 04 2013 03:13:42 EST Ventricular Rate:  63 PR Interval:  164 QRS Duration: 158 QT Interval:  462 QTC Calculation: 473 R Axis:   60 Text Interpretation:  Sinus rhythm Right bundle branch block Baseline  wander in lead(s) V4 Confirmed by Christy Gentles  MD, Grahm Etsitty ([id number]) on 09/04/2013  3:18:43 AM      MDM   Final diagnoses:  Syncope  Nasal fracture  Closed fracture of rib of left side  Nursing notes including past medical history and social history reviewed and considered in documentation Labs/vital reviewed and considered xrays reviewed and considered     Sharyon Cable, MD 09/04/13 0800

## 2014-10-27 ENCOUNTER — Ambulatory Visit
Admission: RE | Admit: 2014-10-27 | Discharge: 2014-10-27 | Disposition: A | Discharge status: Home / Self Care | Payer: 59 | Source: Ambulatory Visit | Attending: Otolaryngology | Admitting: Otolaryngology

## 2014-10-27 ENCOUNTER — Other Ambulatory Visit: Payer: Self-pay | Admitting: Otolaryngology

## 2014-10-27 DIAGNOSIS — R05 Cough: Secondary | ICD-10-CM

## 2014-10-27 DIAGNOSIS — R059 Cough, unspecified: Secondary | ICD-10-CM

## 2014-10-30 ENCOUNTER — Other Ambulatory Visit: Payer: Self-pay | Admitting: Otolaryngology

## 2014-10-30 DIAGNOSIS — J984 Other disorders of lung: Secondary | ICD-10-CM

## 2014-11-07 ENCOUNTER — Other Ambulatory Visit: Payer: 59

## 2014-11-09 ENCOUNTER — Ambulatory Visit
Admission: RE | Admit: 2014-11-09 | Discharge: 2014-11-09 | Disposition: A | Discharge status: Home / Self Care | Payer: 59 | Source: Ambulatory Visit | Attending: Otolaryngology | Admitting: Otolaryngology

## 2014-11-09 DIAGNOSIS — J984 Other disorders of lung: Secondary | ICD-10-CM

## 2014-11-09 MED ORDER — IOPAMIDOL (ISOVUE-300) INJECTION 61%
75.0000 mL | Freq: Once | INTRAVENOUS | Status: AC | PRN
  Administered 2014-11-09: 75 mL via INTRAVENOUS

## 2015-11-21 ENCOUNTER — Other Ambulatory Visit: Payer: Self-pay | Admitting: Family Medicine

## 2015-11-21 DIAGNOSIS — R911 Solitary pulmonary nodule: Secondary | ICD-10-CM

## 2015-11-28 ENCOUNTER — Ambulatory Visit
Admission: RE | Admit: 2015-11-28 | Discharge: 2015-11-28 | Disposition: A | Discharge status: Home / Self Care | Payer: Medicare Other | Source: Ambulatory Visit | Attending: Family Medicine | Admitting: Family Medicine

## 2015-11-28 DIAGNOSIS — R911 Solitary pulmonary nodule: Secondary | ICD-10-CM

## 2018-04-30 ENCOUNTER — Other Ambulatory Visit (HOSPITAL_COMMUNITY): Payer: Self-pay | Admitting: Otolaryngology

## 2018-04-30 DIAGNOSIS — J410 Simple chronic bronchitis: Secondary | ICD-10-CM

## 2018-04-30 DIAGNOSIS — R911 Solitary pulmonary nodule: Secondary | ICD-10-CM

## 2018-05-04 ENCOUNTER — Ambulatory Visit (HOSPITAL_COMMUNITY)
Admission: RE | Admit: 2018-05-04 | Discharge: 2018-05-04 | Disposition: A | Discharge status: Home / Self Care | Payer: Medicare Other | Source: Ambulatory Visit | Attending: Otolaryngology | Admitting: Otolaryngology

## 2018-05-04 ENCOUNTER — Encounter (HOSPITAL_COMMUNITY): Payer: Self-pay

## 2018-05-04 DIAGNOSIS — R911 Solitary pulmonary nodule: Secondary | ICD-10-CM | POA: Diagnosis not present

## 2018-05-04 DIAGNOSIS — J410 Simple chronic bronchitis: Secondary | ICD-10-CM

## 2018-05-04 DIAGNOSIS — I700 Atherosclerosis of aorta: Secondary | ICD-10-CM | POA: Diagnosis not present

## 2018-05-04 LAB — POCT I-STAT CREATININE: Creatinine, Ser: 1 mg/dL (ref 0.61–1.24)

## 2018-05-04 MED ORDER — IOHEXOL 300 MG/ML  SOLN
75.0000 mL | Freq: Once | INTRAMUSCULAR | Status: AC | PRN
  Administered 2018-05-04: 75 mL via INTRAVENOUS

## 2018-05-04 MED ORDER — SODIUM CHLORIDE 0.9 % IJ SOLN
INTRAMUSCULAR | Status: AC
  Filled 2018-05-04: qty 50

## 2018-12-02 ENCOUNTER — Telehealth: Payer: Self-pay

## 2018-12-02 NOTE — Telephone Encounter (Signed)
YOUR CARDIOLOGY TEAM HAS ARRANGED FOR AN E-VISIT FOR YOUR APPOINTMENT - PLEASE REVIEW IMPORTANT INFORMATION BELOW SEVERAL DAYS PRIOR TO YOUR APPOINTMENT  Due to the recent COVID-19 pandemic, we are transitioning in-person office visits to tele-medicine visits in an effort to decrease unnecessary exposure to our patients, their families, and staff. These visits are billed to your insurance just like a normal visit is. We also encourage you to sign up for MyChart if you have not already done so. You will need a smartphone if possible. For patients that do not have this, we can still complete the visit using a regular telephone but do prefer a smartphone to enable video when possible. You may have a family member that lives with you that can help. If possible, we also ask that you have a blood pressure cuff and scale at home to measure your blood pressure, heart rate and weight prior to your scheduled appointment. Patients with clinical needs that need an in-person evaluation and testing will still be able to come to the office if absolutely necessary. If you have any questions, feel free to call our office.     YOUR PROVIDER WILL BE USING THE FOLLOWING PLATFORM TO COMPLETE YOUR VISIT: [email or web address]  . IF USING MYCHART - How to Download the MyChart App to Your SmartPhone   - If Apple, go to App Store and type in MyChart in the search bar and download the app. If Android, ask patient to go to Google Play Store and type in MyChart in the search bar and download the app. The app is free but as with any other app downloads, your phone may require you to verify saved payment information or Apple/Android password.  - You will need to then log into the app with your MyChart username and password, and select [facility] as your healthcare provider to link the account.  - When it is time for your visit, go to the MyChart app, find appointments, and click Begin Video Visit. Be sure to Select Allow for your device to  access the Microphone and Camera for your visit. You will then be connected, and your provider will be with you shortly.  **If you have any issues connecting or need assistance, please contact MyChart service desk [phone] ([phone])**  **If using a computer, in order to ensure the best quality for your visit, you will need to use either of the following Internet Browsers: Google Chrome or Microsoft Edge**  . IF USING DOXIMITY or [email or web address] - The staff will give you instructions on receiving your link to join the meeting the day of your visit.      2-3 DAYS BEFORE YOUR APPOINTMENT  You will receive a telephone call from one of our [facility] team members - your caller ID may say "Unknown caller." If this is a video visit, we will walk you through how to get the video launched on your phone. We will remind you check your blood pressure, heart rate and weight prior to your scheduled appointment. If you have an Apple Watch or Kardia, please upload any pertinent ECG strips the day before or morning of your appointment to MyChart. Our staff will also make sure you have reviewed the consent and agree to move forward with your scheduled tele-health visit.     THE DAY OF YOUR APPOINTMENT  Approximately 15 minutes prior to your scheduled appointment, you will receive a telephone call from one of [facility] team - your caller ID may say "Unknown caller."    Our staff will confirm medications, vital signs for the day and any symptoms you may be experiencing. Please have this information available prior to the time of visit start. It may also be helpful for you to have a pad of paper and pen handy for any instructions given during your visit. They will also walk you through joining the smartphone meeting if this is a video visit.    CONSENT FOR TELE-HEALTH VISIT - PLEASE REVIEW  I hereby voluntarily request, consent and authorize [facility] and its employed or contracted physicians, physician  assistants, nurse practitioners or other licensed health care professionals (the Practitioner), to provide me with telemedicine health care services (the "Services") as deemed necessary by the treating Practitioner. I acknowledge and consent to receive the Services by the Practitioner via telemedicine. I understand that the telemedicine visit will involve communicating with the Practitioner through live audiovisual communication technology and the disclosure of certain medical information by electronic transmission. I acknowledge that I have been given the opportunity to request an in-person assessment or other available alternative prior to the telemedicine visit and am voluntarily participating in the telemedicine visit.  I understand that I have the right to withhold or withdraw my consent to the use of telemedicine in the course of my care at any time, without affecting my right to future care or treatment, and that the Practitioner or I may terminate the telemedicine visit at any time. I understand that I have the right to inspect all information obtained and/or recorded in the course of the telemedicine visit and may receive copies of available information for a reasonable fee.  I understand that some of the potential risks of receiving the Services via telemedicine include:  . Delay or interruption in medical evaluation due to technological equipment failure or disruption; . Information transmitted may not be sufficient (e.g. poor resolution of images) to allow for appropriate medical decision making by the Practitioner; and/or  . In rare instances, security protocols could fail, causing a breach of personal health information.  Furthermore, I acknowledge that it is my responsibility to provide information about my medical history, conditions and care that is complete and accurate to the best of my ability. I acknowledge that Practitioner's advice, recommendations, and/or decision may be based on  factors not within their control, such as incomplete or inaccurate data provided by me or distortions of diagnostic images or specimens that may result from electronic transmissions. I understand that the practice of medicine is not an exact science and that Practitioner makes no warranties or guarantees regarding treatment outcomes. I acknowledge that I will receive a copy of this consent concurrently upon execution via email to the email address I last provided but may also request a printed copy by calling the office of [facility].    I understand that my insurance will be billed for this visit.   I have read or had this consent read to me. . I understand the contents of this consent, which adequately explains the benefits and risks of the Services being provided via telemedicine.  . I have been provided ample opportunity to ask questions regarding this consent and the Services and have had my questions answered to my satisfaction. . I give my informed consent for the services to be provided through the use of telemedicine in my medical care  By participating in this telemedicine visit I agree to the above.  

## 2018-12-06 ENCOUNTER — Telehealth (INDEPENDENT_AMBULATORY_CARE_PROVIDER_SITE_OTHER): Payer: Medicare Other | Admitting: Cardiology

## 2018-12-06 ENCOUNTER — Other Ambulatory Visit: Payer: Self-pay

## 2018-12-06 ENCOUNTER — Encounter: Payer: Self-pay | Admitting: Cardiology

## 2018-12-06 VITALS — BP 153/90 | HR 63 | Ht 72.0 in | Wt 205.0 lb

## 2018-12-06 DIAGNOSIS — I10 Essential (primary) hypertension: Secondary | ICD-10-CM

## 2018-12-06 DIAGNOSIS — E782 Mixed hyperlipidemia: Secondary | ICD-10-CM

## 2018-12-06 DIAGNOSIS — I2510 Atherosclerotic heart disease of native coronary artery without angina pectoris: Secondary | ICD-10-CM | POA: Diagnosis not present

## 2018-12-06 DIAGNOSIS — I4510 Unspecified right bundle-branch block: Secondary | ICD-10-CM

## 2018-12-06 DIAGNOSIS — I2584 Coronary atherosclerosis due to calcified coronary lesion: Secondary | ICD-10-CM | POA: Insufficient documentation

## 2018-12-06 HISTORY — DX: Coronary atherosclerosis due to calcified coronary lesion: I25.84

## 2018-12-06 HISTORY — DX: Unspecified right bundle-branch block: I45.10

## 2018-12-06 HISTORY — DX: Mixed hyperlipidemia: E78.2

## 2018-12-06 HISTORY — DX: Atherosclerotic heart disease of native coronary artery without angina pectoris: I25.10

## 2018-12-06 HISTORY — DX: Essential (primary) hypertension: I10

## 2018-12-06 NOTE — Patient Instructions (Signed)
  Medication Instructions:  The current medical regimen is effective;  continue present plan and medications.  If you need a refill on your cardiac medications before your next appointment, please call your pharmacy.   Follow-Up: Follow up in 1 year with Dr. Skains.  You will receive a letter in the mail 2 months before you are due.  Please call us when you receive this letter to schedule your follow up appointment.  Thank you for choosing [facility]!!     

## 2018-12-06 NOTE — Progress Notes (Signed)
Virtual Visit via Video Note   This visit type was conducted due to national recommendations for restrictions regarding the COVID-19 Pandemic (e.g. social distancing) in an effort to limit this patient's exposure and mitigate transmission in our community.  Due to his co-morbid illnesses, this patient is at least at moderate risk for complications without adequate follow up.  This format is felt to be most appropriate for this patient at this time.  All issues noted in this document were discussed and addressed.  A limited physical exam was performed with this format.  Please refer to the patient's chart for his consent to telehealth for [facility].   Date:  12/06/2018   ID:  Frederick Watkins, DOB 05/21/1946, MRN [id number]  Patient Location: Home Provider Location: Home  PCP:  System, Provider Not In  Cardiologist:  Candee Furbish, MD  Electrophysiologist:  None   Evaluation Performed:  New Patient Evaluation  Chief Complaint: New patient, establish care, former Dr. [name] patient, coronary artery disease  History of Present Illness:    Frederick Watkins is a 73 y.o. male with coronary artery disease, right bundle branch block hypertension, hyperlipidemia here to establish care, former patient of Dr. [name].  Last visit with Dr. [name] was on 01/19/2018.  He has a prior history of coronary artery disease as manifested by coronary artery calcification with a negative nuclear stress test many years ago.  Previously was working out 3 days a week.  No symptoms, no anginal equivalents.  No orthopnea PND palpitations claudication.  Blood pressure and lipids have been under good control.  Slightly increased today but he was walking around just prior to reading.  Normally it is under good control.  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  His granddaughter attends [employer].  She is staying with them throughout the pandemic.   Treadmill Myoview was in July 2017 and was normal.  EF was 82%.    The patient does not have symptoms concerning for COVID-19 infection (fever, chills, cough, or new shortness of breath).    Past Medical History:  Diagnosis Date  . GERD (gastroesophageal reflux disease)   . Hyperlipidemia   . Hypertension   . Mucous cyst of finger LEFT THUMB  . RLS (restless legs syndrome)    Past Surgical History:  Procedure Laterality Date  . EAR CYST EXCISION  05/31/2012   Procedure: CYST REMOVAL;  Surgeon: [name], MD;  Location: [facility];  Service: Orthopedics;  Laterality: Left;  Left Thumb Mucous Cyst Excision  . RETINAL DETACHMENT REPAIR W/ SCLERAL BUCKLE LE  SEPT 2013   W/ CATARACT EXTRACTION WITH LENS IMPLANT , RIGHT EYE     Current Meds  Medication Sig  . Ascorbic Acid (VITAMIN C) 1000 MG tablet Take 1,000 mg by mouth 2 (two) times a day.  Marland Kitchen aspirin EC 81 MG tablet Take 81 mg by mouth daily.  Marland Kitchen atorvastatin (LIPITOR) 40 MG tablet Take 40 mg by mouth daily.  Marland Kitchen lisinopril (PRINIVIL,ZESTRIL) 20 MG tablet Take 20 mg by mouth every evening.  Marland Kitchen LORazepam (ATIVAN) 0.5 MG tablet Take 0.5 mg by mouth every 8 (eight) hours as needed.  . sertraline (ZOLOFT) 100 MG tablet Take 100 mg by mouth daily.     Allergies:   Crestor [rosuvastatin]   Social History   Tobacco Use  . Smoking status: Never Smoker  . Smokeless tobacco: Never Used  Substance Use  Topics  . Alcohol use: Yes    Alcohol/week: 7.0 standard drinks    Types: 7 Glasses of wine per week  . Drug use: No     Family Hx: The patient's family history is not on file.  Father died of lung cancer, mother had Alzheimer's dementia, sister has diabetes.  ROS:   Please see the history of present illness.     All other systems reviewed and are negative.   Prior CV studies:   The following studies were reviewed today:  Nuclear stress test 2017 low risk no ischemia  Labs/Other Tests and Data Reviewed:     EKG:  Prior EKG from 2019 documented as right bundle branch block sinus rhythm  Recent Labs: 05/04/2018: Creatinine, Ser 1.00   Recent Lipid Panel No results found for: CHOL, TRIG, HDL, CHOLHDL, LDLCALC, LDLDIRECT  Wt Readings from Last 3 Encounters:  12/06/18 205 lb (93 kg)  09/04/13 205 lb (93 kg)  05/31/12 205 lb (93 kg)     Objective:    Vital Signs:  BP (!) 153/90   Pulse 63   Ht 6' (1.829 m)   Wt 205 lb (93 kg)   BMI 27.80 kg/m    VITAL SIGNS:  reviewed GEN:  no acute distress EYES:  sclerae anicteric, EOMI - Extraocular Movements Intact RESPIRATORY:  normal respiratory effort, symmetric expansion SKIN:  no rash, lesions or ulcers. MUSCULOSKELETAL:  no obvious deformities. NEURO:  alert and oriented x 3, no obvious focal deficit PSYCH:  normal affect  ASSESSMENT & PLAN:    Coronary artery disease - Diagnosed from elevated coronary calcium score with negative nuclear stress test in 2017.  Continue with aggressive secondary prevention.  Taking low-dose aspirin.  Essential hypertension -Currently well controlled on average.  No changes made.  Hyperlipidemia -Excellent control.  Doing well.  Last HDL 77, LDL 47.  At one point was on simvastatin but then he changed to atorvastatin and is doing much better from a numbers perspective.  No myalgias.  Right bundle branch block - No high risk symptoms such as syncope.  Doing well.  COVID-19 Education: The signs and symptoms of COVID-19 were discussed with the patient and how to seek care for testing (follow up with PCP or arrange E-visit).  The importance of social distancing was discussed today.  Time:   Today, I have spent 20 minutes with the patient with telehealth technology discussing the above problems.     Medication Adjustments/Labs and Tests Ordered: Current medicines are reviewed at length with the patient today.  Concerns regarding medicines are outlined above.   Tests Ordered: No orders of the  defined types were placed in this encounter.   Medication Changes: No orders of the defined types were placed in this encounter.   Disposition:  Follow up in 1 year(s)  Signed, Candee Furbish, MD  12/06/2018 1:53 PM    [facility]

## 2019-08-03 ENCOUNTER — Ambulatory Visit: Payer: Medicare Other

## 2019-08-12 ENCOUNTER — Ambulatory Visit: Payer: Medicare Other

## 2019-08-27 ENCOUNTER — Ambulatory Visit: Payer: Medicare Other

## 2019-12-14 ENCOUNTER — Encounter: Payer: Self-pay | Admitting: Neurology

## 2020-01-02 ENCOUNTER — Telehealth: Payer: Self-pay | Admitting: Cardiology

## 2020-01-02 NOTE — Telephone Encounter (Signed)
    Pt said he's been feeling numbness in his feet and pain and throbbing below his knees. He also said it keeping him at night and he can feel it all though out the day. He's been feeling it for 2 weeks now. He is wondering if he can see Dr. Marlou Porch right away to have it check

## 2020-01-02 NOTE — Telephone Encounter (Signed)
Spoke with pt regarding his recent s/s that started 2 to 3 weeks ago per his report.  He has noticed numbness in his feet, tingling/throbbing in his calves bilaterally.  He has also woke with pain in both legs.  He denies any swelling, discoloration, CP or SOB.  He also feels tingling in his arms at times as well.  He reports recent travel to [place] and to [place], [place] via car.  He sates he did stop frequently to walk around.  No redness/swelling/streaking in either leg.  He reports compression stockings do seem to help with the throbbing in his leg.  Nothing else seems to make it worse or better.  He reports his PCP - Dr Azalia Bilis did recently start him (about 1 month ago) on a medication for dementia that "starts with a D"  He has been having "crazy dreams" since starting this medication.  Advised pt to call his PCP to make him aware of these recent s/s since starting this new medication.  Pt will also discuss whether his numbness, tingling and pain could be caused by his atorvastatin. Pt expressed gratitude for the call and will contact his PCP.  He will c/b to f/u if necessary.

## 2020-03-06 ENCOUNTER — Other Ambulatory Visit: Payer: Self-pay

## 2020-03-06 ENCOUNTER — Encounter: Payer: Self-pay | Admitting: Cardiology

## 2020-03-06 ENCOUNTER — Ambulatory Visit: Payer: Medicare Other | Admitting: Cardiology

## 2020-03-06 VITALS — BP 120/88 | HR 80 | Ht 72.0 in | Wt 206.0 lb

## 2020-03-06 DIAGNOSIS — E782 Mixed hyperlipidemia: Secondary | ICD-10-CM

## 2020-03-06 DIAGNOSIS — I4510 Unspecified right bundle-branch block: Secondary | ICD-10-CM

## 2020-03-06 DIAGNOSIS — I2510 Atherosclerotic heart disease of native coronary artery without angina pectoris: Secondary | ICD-10-CM | POA: Diagnosis not present

## 2020-03-06 DIAGNOSIS — I2584 Coronary atherosclerosis due to calcified coronary lesion: Secondary | ICD-10-CM

## 2020-03-06 NOTE — Progress Notes (Signed)
Cardiology Office Note:    Date:  03/06/2020   ID:  Frederick Watkins, DOB 07-Jun-1946, MRN [id number]  PCP:  System, Provider Not In  [place] Cardiologist:  Candee Furbish, MD  [place] Electrophysiologist:  None   Referring MD: No ref. provider found     History of Present Illness:    Frederick Watkins is a 74 y.o. male here for the follow-up of coronary artery disease regular branch block hypertension hyperlipidemia.  Former patient of Dr. [name].  Former Physiological scientist.  Daughter, [employer] band  Coronary artery calcification with negative nuclear stress test many years ago.  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  His granddaughter from [place] was staying with him throughout the pandemic.  EF was 82% on treadmill Myoview in 2017.  Overall doing well without any chest pain.  He has had some leg tingling.  See below.  Labs from outside show LDL 88, hemoglobin is 15.7, creatinine 0.9, ALT 27 TSH 1.9  Past Medical History:  Diagnosis Date  . GERD (gastroesophageal reflux disease)   . Hyperlipidemia   . Hypertension   . Mucous cyst of finger LEFT THUMB  . RLS (restless legs syndrome)     Past Surgical History:  Procedure Laterality Date  . EAR CYST EXCISION  05/31/2012   Procedure: CYST REMOVAL;  Surgeon: [name], MD;  Location: [facility];  Service: Orthopedics;  Laterality: Left;  Left Thumb Mucous Cyst Excision  . RETINAL DETACHMENT REPAIR W/ SCLERAL BUCKLE LE  SEPT 2013   W/ CATARACT EXTRACTION WITH LENS IMPLANT , RIGHT EYE    Current Medications: Current Meds  Medication Sig  . Ascorbic Acid (VITAMIN C) 1000 MG tablet Take 1,000 mg by mouth 2 (two) times a day.  Marland Kitchen aspirin EC 81 MG tablet Take 81 mg by mouth daily.  Marland Kitchen atorvastatin (LIPITOR) 40 MG tablet Take 40 mg by mouth daily.  Marland Kitchen donepezil (ARICEPT) 10 MG tablet Take 10 mg by mouth daily.  Marland Kitchen lisinopril  (PRINIVIL,ZESTRIL) 20 MG tablet Take 20 mg by mouth every evening.  Marland Kitchen LORazepam (ATIVAN) 0.5 MG tablet Take 0.5 mg by mouth every 8 (eight) hours as needed.  . pantoprazole (PROTONIX) 40 MG tablet Take 40 mg by mouth 2 (two) times daily.  . sertraline (ZOLOFT) 100 MG tablet Take 100 mg by mouth daily.     Allergies:   Crestor [rosuvastatin]   Social History   Socioeconomic History  . Marital status: Married    Spouse name: Not on file  . Number of children: Not on file  . Years of education: Not on file  . Highest education level: Not on file  Occupational History  . Not on file  Tobacco Use  . Smoking status: Never Smoker  . Smokeless tobacco: Never Used  Substance and Sexual Activity  . Alcohol use: Yes    Alcohol/week: 7.0 standard drinks    Types: 7 Glasses of wine per week  . Drug use: No  . Sexual activity: Not on file  Other Topics Concern  . Not on file  Social History Narrative  . Not on file   Social Determinants of Health   Financial Resource Strain:   . Difficulty of Paying Living Expenses: Not on file  Food Insecurity:   . Worried About Charity fundraiser in the Last Year: Not on file  . Ran Out of Food in the Last Year:  Not on file  Transportation Needs:   . Lack of Transportation (Medical): Not on file  . Lack of Transportation (Non-Medical): Not on file  Physical Activity:   . Days of Exercise per Week: Not on file  . Minutes of Exercise per Session: Not on file  Stress:   . Feeling of Stress : Not on file  Social Connections:   . Frequency of Communication with Friends and Family: Not on file  . Frequency of Social Gatherings with Friends and Family: Not on file  . Attends Religious Services: Not on file  . Active Member of Clubs or Organizations: Not on file  . Attends Archivist Meetings: Not on file  . Marital Status: Not on file    ROS:   Please see the history of present illness.     All other systems reviewed and are  negative.  EKGs/Labs/Other Studies Reviewed:     EKG:  EKG is  ordered today.  The ekg ordered today demonstrates his rhythm 80 nonspecific ST-T wave changes right bundle branch block no change from prior  Recent Labs: No results found for requested labs within last 8760 hours.  Recent Lipid Panel No results found for: CHOL, TRIG, HDL, CHOLHDL, VLDL, LDLCALC, LDLDIRECT  Physical Exam:    VS:  BP 120/88   Pulse 80   Ht 6' (1.829 m)   Wt 206 lb (93.4 kg)   SpO2 95%   BMI 27.94 kg/m     Wt Readings from Last 3 Encounters:  03/06/20 206 lb (93.4 kg)  12/06/18 205 lb (93 kg)  09/04/13 205 lb (93 kg)     GEN: Well nourished, well developed in no acute distress HEENT: Normal NECK: No JVD; No carotid bruits LYMPHATICS: No lymphadenopathy CARDIAC: RRR, no murmurs, rubs, gallops RESPIRATORY:  Clear to auscultation without rales, wheezing or rhonchi  ABDOMEN: Soft, non-tender, non-distended MUSCULOSKELETAL:  No edema; No deformity  SKIN: Warm and dry NEUROLOGIC:  Alert and oriented x 3 PSYCHIATRIC:  Normal affect   ASSESSMENT:    1. Coronary artery disease involving native coronary artery of native heart without angina pectoris   2. Coronary artery calcification   3. Mixed hyperlipidemia   4. Right bundle branch block    PLAN:    In order of problems listed above:  Coronary artery disease -Calcified plaque from calcium score.  Negative nuclear stress test 2017.  Continue with aggressive secondary risk factor prevention, low-dose aspirin, atorvastatin.  Hyperlipidemia -Currently good control.  LDL 47 from outside labs.  Change to atorvastatin from simvastatin and doing better.  No myalgias.   Peripheral neuropathy --tingles on legs, could be peripheral neuropathy.  Also feels some tingling at times in his fingertips.  Spoke to him about this.  May wish to have Dr. [name] at some point investigate this further.  He is going to see a neurologist about possible early memory  issues.  Asked him to discuss that with him as well.  Right bundle branch block, no high risk symptoms such as syncope.  Essential hypertension -Overall well controlled on average.  Sometimes elevated in the doctor's office.     Medication Adjustments/Labs and Tests Ordered: Current medicines are reviewed at length with the patient today.  Concerns regarding medicines are outlined above.  Orders Placed This Encounter  Procedures  . EKG 12-Lead   No orders of the defined types were placed in this encounter.   Patient Instructions  Medication Instructions:  The current medical regimen is  effective;  continue present plan and medications.  *If you need a refill on your cardiac medications before your next appointment, please call your pharmacy*  Follow-Up: At [facility], you and your health needs are our priority.  As part of our continuing mission to provide you with exceptional heart care, we have created designated Provider Care Teams.  These Care Teams include your primary Cardiologist (physician) and Advanced Practice Providers (APPs -  Physician Assistants and Nurse Practitioners) who all work together to provide you with the care you need, when you need it.  We recommend signing up for the patient portal called "MyChart".  Sign up information is provided on this After Visit Summary.  MyChart is used to connect with patients for Virtual Visits (Telemedicine).  Patients are able to view lab/test results, encounter notes, upcoming appointments, etc.  Non-urgent messages can be sent to your provider as well.   To learn more about what you can do with MyChart, go to [email or web address].    Your next appointment:   12 month(s)  The format for your next appointment:   In Person  Provider:   Candee Furbish, MD  Thank you for choosing [facility]!!         Signed, Candee Furbish, MD  03/06/2020 4:59 PM    [place]

## 2020-03-06 NOTE — Patient Instructions (Signed)
Medication Instructions:  The current medical regimen is effective;  continue present plan and medications.  *If you need a refill on your cardiac medications before your next appointment, please call your pharmacy*  Follow-Up: At [facility], you and your health needs are our priority.  As part of our continuing mission to provide you with exceptional heart care, we have created designated Provider Care Teams.  These Care Teams include your primary Cardiologist (physician) and Advanced Practice Providers (APPs -  Physician Assistants and Nurse Practitioners) who all work together to provide you with the care you need, when you need it.  We recommend signing up for the patient portal called "MyChart".  Sign up information is provided on this After Visit Summary.  MyChart is used to connect with patients for Virtual Visits (Telemedicine).  Patients are able to view lab/test results, encounter notes, upcoming appointments, etc.  Non-urgent messages can be sent to your provider as well.   To learn more about what you can do with MyChart, go to [email or web address].    Your next appointment:   12 month(s)  The format for your next appointment:   In Person  Provider:   Mark Skains, MD   Thank you for choosing [facility]!!      

## 2020-03-21 ENCOUNTER — Encounter: Payer: Self-pay | Admitting: Neurology

## 2020-03-21 ENCOUNTER — Ambulatory Visit: Payer: Medicare Other | Admitting: Neurology

## 2020-03-21 ENCOUNTER — Other Ambulatory Visit: Payer: Self-pay

## 2020-03-21 VITALS — BP 127/79 | HR 71 | Ht 72.0 in | Wt 211.2 lb

## 2020-03-21 DIAGNOSIS — G3184 Mild cognitive impairment, so stated: Secondary | ICD-10-CM

## 2020-03-21 DIAGNOSIS — G629 Polyneuropathy, unspecified: Secondary | ICD-10-CM | POA: Diagnosis not present

## 2020-03-21 MED ORDER — GABAPENTIN 100 MG PO CAPS: 100.0000 mg | ORAL_CAPSULE | Freq: Every day | ORAL | 3 refills | Status: DC

## 2020-03-21 NOTE — Progress Notes (Signed)
NEUROLOGY CONSULTATION NOTE  Frederick Watkins MRN: [id number] DOB: 1945-09-29  Referring provider: Dr. Shirline Frees Primary care provider: Dr. Shirline Frees  Reason for consult:  Memory loss, increasing agitation  Dear Dr Frederick Watkins:  Thank you for your kind referral of Frederick Watkins for consultation of the above symptoms. Although his history is well known to you, please allow me to reiterate it for the purpose of our medical record. He is alone in the office today. Records and images were personally reviewed where available.   HISTORY OF PRESENT ILLNESS: This is a pleasant 74 year old right-handed man with a history of hypertension, hyperlipidemia, CAD, RBBB, depression, presenting for evaluation of memory loss and increasing agitation. He feels his memory is not as good as it used to be. He still feels confident, but his wife of 76 years tells him the he does not remember things from time to time. Memory changes started around a year ago. He denies getting lost driving. He denies missing medications. 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 He denies misplacing things frequently or leaving the stove on. His mother had dementia. He had a syncopal episode attributed to new medication in 2015 and fractured his nasal bone, no syncopal episodes since then. He drinks 1-2 glasses of wine nightly. SLUMS score 22/30 in April 2021 at PCP office. He was started on Donepezil, dose increased to 10mg  daily 2 months ago, no side effects.  He denies any headaches, dizziness, diplopia, dysarthria/dysphagia, neck/back pain, anosmia, or tremors. He has intermittent tingling in both feet, like they are asleep. They seem worse at night. He has some in his hands, but not as much. He notices a little weakness in his legs in the mornings when he gets up. He has constipation and some urinary incontinence (dribbling). He usually gets 5-6 hours of sleep, he is usually refreshed  but tired some mornings. No daytime drowsiness. He has noticed increased irritability the past couple of years. His wife has noticed he does not talk as much as he used to. He tends to get upset at insignificant things. When he gets upset, he just wants to be alone and leaves the house, tending to isolate his wife for a day. This has increased in frequency the past 6 months. No paranoia or hallucinations. He has been on Sertraline 100mg  daily for the past 2 years. He has been on Lorazepam 0.5mg  1-2 times a day for anxiety, the extra dose also helps him sleep at night. They try to eat healthy and exercise regularly.    PAST MEDICAL HISTORY: Past Medical History:  Diagnosis Date  . GERD (gastroesophageal reflux disease)   . Hyperlipidemia   . Hypertension   . Mucous cyst of finger LEFT THUMB  . RLS (restless legs syndrome)     PAST SURGICAL HISTORY: Past Surgical History:  Procedure Laterality Date  . EAR CYST EXCISION  05/31/2012   Procedure: CYST REMOVAL;  Surgeon: [name], MD;  Location[facility];  Service: Orthopedics;  Laterality: Left;  Left Thumb Mucous Cyst Excision  . RETINAL DETACHMENT REPAIR W/ SCLERAL BUCKLE LE  SEPT 2013   W/ CATARACT EXTRACTION WITH LENS IMPLANT , RIGHT EYE    MEDICATIONS: Current Outpatient Medications on File Prior to Visit  Medication Sig Dispense Refill  . Ascorbic Acid (VITAMIN C) 1000 MG tablet Take 1,000 mg by mouth 2 (two) times a day.    Marland Kitchen aspirin EC 81  MG tablet Take 81 mg by mouth daily.    Marland Kitchen atorvastatin (LIPITOR) 40 MG tablet Take 40 mg by mouth daily.    Marland Kitchen donepezil (ARICEPT) 10 MG tablet Take 10 mg by mouth daily.    Marland Kitchen lisinopril (PRINIVIL,ZESTRIL) 20 MG tablet Take 20 mg by mouth every evening.    Marland Kitchen LORazepam (ATIVAN) 0.5 MG tablet Take 0.5 mg by mouth every 8 (eight) hours as needed.    . pantoprazole (PROTONIX) 40 MG tablet Take 40 mg by mouth 2 (two) times daily.    . sertraline (ZOLOFT) 100 MG tablet Take 100  mg by mouth daily.     No current facility-administered medications on file prior to visit.    ALLERGIES: Allergies  Allergen Reactions  . Crestor [Rosuvastatin] Swelling    TONGUE SWELLS    FAMILY HISTORY: No family history on file.  SOCIAL HISTORY: Social History   Socioeconomic History  . Marital status: Married    Spouse name: Not on file  . Number of children: Not on file  . Years of education: Not on file  . Highest education level: Not on file  Occupational History  . Not on file  Tobacco Use  . Smoking status: Never Smoker  . Smokeless tobacco: Never Used  Substance and Sexual Activity  . Alcohol use: Yes    Alcohol/week: 7.0 standard drinks    Types: 7 Glasses of wine per week  . Drug use: No  . Sexual activity: Not on file  Other Topics Concern  . Not on file  Social History Narrative  . Not on file   Social Determinants of Health   Financial Resource Strain:   . Difficulty of Paying Living Expenses: Not on file  Food Insecurity:   . Worried About Charity fundraiser in the Last Year: Not on file  . Ran Out of Food in the Last Year: Not on file  Transportation Needs:   . Lack of Transportation (Medical): Not on file  . Lack of Transportation (Non-Medical): Not on file  Physical Activity:   . Days of Exercise per Week: Not on file  . Minutes of Exercise per Session: Not on file  Stress:   . Feeling of Stress : Not on file  Social Connections:   . Frequency of Communication with Friends and Family: Not on file  . Frequency of Social Gatherings with Friends and Family: Not on file  . Attends Religious Services: Not on file  . Active Member of Clubs or Organizations: Not on file  . Attends Archivist Meetings: Not on file  . Marital Status: Not on file  Intimate Partner Violence:   . Fear of Current or Ex-Partner: Not on file  . Emotionally Abused: Not on file  . Physically Abused: Not on file  . Sexually Abused: Not on file     PHYSICAL EXAM: Vitals:   03/21/20 0858  BP: 127/79  Pulse: 71  SpO2: 95%   General: No acute distress Head:  Normocephalic/atraumatic Skin/Extremities: No rash, no edema Neurological Exam: Mental status: alert and oriented to person, place, and time, no dysarthria or aphasia, Fund of knowledge is appropriate.  Recent and remote memory are intact.  Attention and concentration are normal. SLUMS 27/30.  [employer] Mental Exam 03/21/2020  Weekday Correct 1  Current year 1  What state are we in? 1  Amount spent 1  Amount left 2  # of Animals 3  5 objects recall 4  Number series  2  Hour markers 2  Time correct 2  Placed X in triangle correctly 1  Largest Figure 1  Name of male 2  Date back to work 0  Type of work 2  State she lived in 2  Total score 27   Cranial nerves: CN I: not tested CN II: pupils equal, round and reactive to light, visual fields intact CN III, IV, VI:  full range of motion, no nystagmus, no ptosis CN V: facial sensation intact CN VII: upper and lower face symmetric CN VIII: hearing intact to conversation Bulk & Tone: normal, no fasciculations. Motor: 5/5 throughout with no pronator drift. Sensation: intact to light touch, cold, pin on both UE, decreased pin, cold, vibration sense to ankles bilaterally. No extinction to double simultaneous stimulation.  Romberg test negative Deep Tendon Reflexes: +1 both UE, +2 bilateral patella, absent ankle jerks bilaterally, no ankle clonus Plantar responses: downgoing bilaterally Cerebellar: no incoordination on finger to nose testing Gait: narrow-based and steady, able to tandem walk adequately. Tremor: none   IMPRESSION: This is a pleasant 74 year old right-handed man with a history of hypertension, hyperlipidemia, CAD, RBBB, depression, presenting for evaluation of memory loss and increasing agitation. He is also reporting paresthesias in both feet. 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 SLUMS score today 27/30 (22/30 in April 2021 PCP office). 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 Mild cognitive impairment means there are serious cognitive problems by report and testing but the patient is functioning normally. Around 50% of MCI patients progress to dementia (functional impairment) over 5 years. We discussed different causes of memory loss, if not done, check TSH and B12 (also for neuropathy workup). MRI brain without contrast will be ordered to assess for underlying structural abnormality and assess vascular load. He will be scheduled for Neurocognitive testing to further evaluate cognitive concerns. Continue Donepezil 10mg  daily, we discussed expectations from medication. We discussed mood changes that can occur with memory changes, he will discuss increasing Sertraline dose with PCP, consider increasing to 150mg  daily. We also discussed findings of neuropathy, he would like to start gabapentin for symptomatic treatment, side effects discussed. Start gabapentin 100mg  qhs, we may increase dose if needed. We discussed the importance of control of vascular risk factors, physical exercise, and brain stimulation exercises for brain health. Follow-up in 6 months, he knows to call for any changes.   Thank you for allowing me to participate in the care of this patient. Please do not hesitate to call for any questions or concerns.   Ellouise Newer, M.D.  CC: Dr. Kenton Watkins

## 2020-03-21 NOTE — Patient Instructions (Addendum)
1. Schedule MRI brain without contrast  2. Schedule Neurocognitive testing  3. Start Gabapentin 100mg : take 1 capsule every night. If no side effects and still having a lot of neuropathy symptoms, we can increase dose.  4. Continue Donepezil 10mg  daily  5. Discuss increasing Sertraline dose to 150mg  daily with Dr. [name]  6. Bloodwork from Dr. [name] will be requested for review, we will let you know if additional bloodwork is needed  7. There are some activities which have therapeutic value and can be useful in keeping you cognitively stimulated. You can try this website: https://www.[facility].org/get-to-know-barrow/centers-programs/neurorehabilitation-center/neuro-rehab-apps-and-games/ which has options, categorized by level of difficulty.  8. Follow-up in 6 months, call for any changes   RECOMMENDATIONS FOR ALL PATIENTS WITH MEMORY PROBLEMS: 1. Continue to exercise (Recommend 30 minutes of walking everyday, or 3 hours every week) 2. Increase social interactions - continue going to [place] and enjoy social gatherings with friends and family 3. Eat healthy, avoid fried foods and eat more fruits and vegetables 4. Maintain adequate blood pressure, blood sugar, and blood cholesterol level. Reducing the risk of stroke and cardiovascular disease also helps promoting better memory. 5. Avoid stressful situations. Live a simple life and avoid aggravations. Organize your time and prepare for the next day in anticipation. 6. Sleep well, avoid any interruptions of sleep and avoid any distractions in the bedroom that may interfere with adequate sleep quality 7. Avoid sugar, avoid sweets as there is a strong link between excessive sugar intake, diabetes, and cognitive impairment We discussed the Mediterranean diet, which has been shown to help patients reduce the risk of progressive memory disorders and reduces cardiovascular risk. This includes eating fish, eat fruits and green leafy vegetables, nuts  like almonds and hazelnuts, walnuts, and also use olive oil. Avoid fast foods and fried foods as much as possible. Avoid sweets and sugar as sugar use has been linked to worsening of memory function.

## 2020-03-26 ENCOUNTER — Telehealth: Payer: Self-pay | Admitting: Neurology

## 2020-03-26 NOTE — Telephone Encounter (Signed)
Bloodwork from PCP office reviewed, TSH normal 1.990.   Heather, pls order B12 for memory loss and neuropathy. Thanks!

## 2020-03-27 ENCOUNTER — Other Ambulatory Visit: Payer: Self-pay

## 2020-03-27 DIAGNOSIS — R413 Other amnesia: Secondary | ICD-10-CM

## 2020-03-27 DIAGNOSIS — G629 Polyneuropathy, unspecified: Secondary | ICD-10-CM

## 2020-03-27 NOTE — Telephone Encounter (Signed)
B12 ordered will call pt to inform him that he needs to come have the lab work done,

## 2020-03-27 NOTE — Telephone Encounter (Signed)
Spoke with pt informed him we needed him to get his b12 level check, needed to come to our office to check in then go to 2 floor 211 to have lab work done pt verbalized understanding stated he would come and get it done,

## 2020-03-28 ENCOUNTER — Other Ambulatory Visit (INDEPENDENT_AMBULATORY_CARE_PROVIDER_SITE_OTHER): Payer: Medicare Other

## 2020-03-28 ENCOUNTER — Other Ambulatory Visit: Payer: Self-pay

## 2020-03-28 DIAGNOSIS — G629 Polyneuropathy, unspecified: Secondary | ICD-10-CM | POA: Diagnosis not present

## 2020-03-28 DIAGNOSIS — R413 Other amnesia: Secondary | ICD-10-CM

## 2020-03-28 LAB — VITAMIN B12: Vitamin B-12: >1526 pg/mL — ABNORMAL HIGH (ref 211–911)

## 2020-03-29 ENCOUNTER — Telehealth: Payer: Self-pay

## 2020-03-29 NOTE — Telephone Encounter (Signed)
Pt called no answer per DPR left a voice mail that B12 level looks good any questions call the office back,

## 2020-03-29 NOTE — Telephone Encounter (Signed)
-----   Message from Cameron Sprang, MD sent at 03/29/2020  9:04 AM EDT ----- Pls let him know B12 level looks good, thanks

## 2020-04-04 ENCOUNTER — Encounter: Payer: Self-pay | Admitting: Neurology

## 2020-04-10 ENCOUNTER — Other Ambulatory Visit: Payer: Medicare Other

## 2020-04-17 ENCOUNTER — Other Ambulatory Visit: Payer: Self-pay

## 2020-04-17 ENCOUNTER — Ambulatory Visit
Admission: RE | Admit: 2020-04-17 | Discharge: 2020-04-17 | Disposition: A | Discharge status: Home / Self Care | Payer: Medicare Other | Source: Ambulatory Visit | Attending: Neurology | Admitting: Neurology

## 2020-04-17 DIAGNOSIS — G629 Polyneuropathy, unspecified: Secondary | ICD-10-CM

## 2020-04-17 DIAGNOSIS — G3184 Mild cognitive impairment, so stated: Secondary | ICD-10-CM

## 2020-04-18 ENCOUNTER — Telehealth: Payer: Self-pay

## 2020-04-18 NOTE — Telephone Encounter (Signed)
Pt called and informed that MRI brain showed no evidence of tumor, new stroke, or bleed. It showed age-related changes, proceed with memory testing as scheduled

## 2020-04-18 NOTE — Telephone Encounter (Signed)
-----   Message from Cameron Sprang, MD sent at 04/18/2020  9:55 AM EDT ----- Pls let patient know the MRI brain showed no evidence of tumor, new stroke, or bleed. It showed age-related changes, proceed with memory testing as scheduled. Thanks

## 2020-04-25 ENCOUNTER — Other Ambulatory Visit: Payer: Self-pay

## 2020-04-25 ENCOUNTER — Encounter: Payer: Self-pay | Admitting: Psychology

## 2020-04-25 ENCOUNTER — Ambulatory Visit: Payer: Medicare Other

## 2020-04-25 ENCOUNTER — Ambulatory Visit (INDEPENDENT_AMBULATORY_CARE_PROVIDER_SITE_OTHER): Payer: Medicare Other | Admitting: Psychology

## 2020-04-25 DIAGNOSIS — R4189 Other symptoms and signs involving cognitive functions and awareness: Secondary | ICD-10-CM | POA: Diagnosis not present

## 2020-04-25 DIAGNOSIS — I6381 Other cerebral infarction due to occlusion or stenosis of small artery: Secondary | ICD-10-CM | POA: Diagnosis not present

## 2020-04-25 DIAGNOSIS — K219 Gastro-esophageal reflux disease without esophagitis: Secondary | ICD-10-CM | POA: Insufficient documentation

## 2020-04-25 NOTE — Progress Notes (Signed)
NEUROPSYCHOLOGICAL EVALUATION [place]. [facility] Department of Neurology  Date of Evaluation: April 25, 2020  Reason for Referral:   Frederick Watkins is a 74 y.o. right-handed Caucasian male referred by Ellouise Newer, M.D., to characterize his current cognitive functioning and assist with diagnostic clarity and treatment planning in the context of subjective cognitive decline.   Assessment and Plan:   Clinical Impression(s): Frederick Watkins pattern of performance is suggestive of neuropsychological functioning generally within appropriate normative ranges based upon premorbid intellectual estimates. Performance variability was exhibited across executive functioning with relative weaknesses seen across tasks assessing cognitive flexibility and pattern recognition/adaptability. Variability was also exhibited across memory tasks. Specifically, he exhibited a weakness learning novel story information; learning and retention scores across other tasks were appropriate. Performance was also broadly appropriate across processing speed, attention/concentration, receptive and expressive language, and visuospatial abilities. Frederick Watkins denied difficulties completing instrumental activities of daily living (ADLs) independently.  Patterns of relative weakness across testing are largely consistent with a primary vascular etiology. Frederick Watkins does have a history of a left basal ganglia lacunar infarct and several cardiovascular ailments, which would further be consistent with this presentation. However, I do not feel that current test performance warrants a formal diagnosis of a mild vascular neurocognitive disorder at the present time. Mild symptoms of acute anxiety reported across mood-related questionnaires could also influence cognitive functioning to a mild extent. Specific to memory, Frederick Watkins was largely able to learn novel verbal and visual information efficiently and retain this  knowledge after lengthy delays. Overall, memory performance combined with intact performances across other areas of cognitive functioning is not suggestive of Alzheimer's disease. Likewise, his cognitive and behavioral profile is not suggestive of any other form of neurodegenerative illness presently.  Recommendations: Vascular etiologies are often stable over time assuming no additional cerebrovascular events. Should Frederick Watkins have increased concerns surrounding cognitive and/or functional decline in the future, a repeat evaluation would be warranted at that time. The current evaluation will serve as an excellent baseline to draw any future comparisons against.   He reported self-discontinuing donepezil which was prescribed by his PCP due to the presence of negative side effects (e.g., very vivid and distressing dreams). I support the cessation of this medication. Current test performances do not indicate its use.   Frederick Watkins is encouraged to attend to lifestyle factors for brain health (e.g., regular physical exercise, good nutrition habits, regular participation in cognitively-stimulating activities, and general stress management techniques), which are likely to have benefits for both emotional adjustment and cognition. In fact, in addition to promoting good general health, regular exercise incorporating aerobic activities (e.g., brisk walking, jogging, cycling, etc.) has been demonstrated to be a very effective treatment for depression and stress, with similar efficacy rates to both antidepressant medication and psychotherapy. Optimal control of vascular risk factors (including safe cardiovascular exercise and adherence to dietary recommendations) is encouraged.   If interested, there are some activities which have therapeutic value and can be useful in keeping him cognitively stimulated. For suggestions, Frederick Watkins is encouraged to go to the following website:  https://www.[facility].org/get-to-know-barrow/centers-programs/neurorehabilitation-center/neuro-rehab-apps-and-games/ which has options, categorized by level of difficulty. It should be noted that these activities should not be viewed as a substitute for therapy.  When learning new information, he would benefit from information being broken up into small, manageable pieces. He may also find it helpful to articulate the material in his own words and in a context to promote encoding at the onset of  a new task. This material may need to be repeated multiple times to promote encoding.  Memory can be improved using internal strategies such as rehearsal, repetition, chunking, mnemonics, association, and imagery. External strategies such as written notes in a consistently used memory journal, visual and nonverbal auditory cues such as a calendar on the refrigerator or appointments with alarm, such as on a cell phone, can also help maximize recall.    To address problems with cognitive flexibility and adaptability, he may wish to consider:   -Avoiding external distractions when needing to concentrate   -Limiting exposure to fast paced environments with multiple sensory demands   -Writing down complicated information and using checklists   -Attempting and completing one task at a time (i.e., no multi-tasking)   -Verbalizing aloud each step of a task to maintain focus   -Reducing the amount of information considered at one time  Review of Records:   Frederick Watkins was seen by [facility] Marland KitchenEllouise Newer, M.D.) on 03/21/2020 for an evaluation of memory loss and increasing agitation. Frederick Watkins described his memory as not as good as it used to be. Despite this, he reported still feeling confident. However, his wife has told him that he does not remember things from time to time. Memory changes were said to have started around a year ago. He shares financial management/bill paying with his wife and denied  difficulties with medication management or driving. He also denied headaches, dizziness, diplopia, dysarthria/dysphagia, neck/back pain, anosmia, or tremors. He did report intermittent tingling in both feet and (to a lesser extent) and hands; this seems worse at night. He reported increased irritability over the past couple of years. His wife noticed that he does not talk as much as he used to and that he tends to get upset at insignificant things. When he gets upset, his wife stated that he just wants to be alone and leaves the house, tending to isolate his wife for a day. This has increased in frequency during the past 6 months. Performance on a brief cognitive screening instrument (SLUMS) was 22/30 with his PCP in April 2021. However, he scored 47/30 with Dr. Delice Lesch. Ultimately, Frederick Watkins was referred for a comprehensive neuropsychological evaluation to characterize his cognitive abilities and to assist with diagnostic clarity and treatment planning.   Head CT on 09/04/2013 in the context of a syncopal episode/fall revealed a nasal bone fracture but no intracranial abnormalities. Mild cortical volume loss was noted, as well as a chronic lacunar infarct in the left basal ganglia. Brain MRI on 04/17/2020 revealed mild cerebral atrophy, mild chronic microvascular ischemic changes, and a remote left basal ganglia infarct.   Past Medical History:  Diagnosis Date  . Coronary artery calcification 12/06/2018  . Coronary artery disease involving native coronary artery of native heart without angina pectoris 12/06/2018  . Essential hypertension 12/06/2018  . GERD (gastroesophageal reflux disease)   . Lacunar infarct    Left basal ganglia  . Mixed hyperlipidemia 12/06/2018  . Mucous cyst of finger    Left thumb  . Right bundle branch block 12/06/2018  . RLS (restless legs syndrome)     Past Surgical History:  Procedure Laterality Date  . EAR CYST EXCISION  05/31/2012   Procedure: CYST REMOVAL;  Surgeon: [name], MD;  Location: [facility];  Service: Orthopedics;  Laterality: Left;  Left Thumb Mucous Cyst Excision  . RETINAL DETACHMENT REPAIR W/ SCLERAL BUCKLE LE  SEPT 2013   W/ CATARACT EXTRACTION  WITH LENS IMPLANT , RIGHT EYE    Current Outpatient Medications:  .  Ascorbic Acid (VITAMIN C) 1000 MG tablet, Take 1,000 mg by mouth 2 (two) times a day., Disp: , Rfl:  .  aspirin EC 81 MG tablet, Take 81 mg by mouth daily., Disp: , Rfl:  .  atorvastatin (LIPITOR) 40 MG tablet, Take 40 mg by mouth daily., Disp: , Rfl:  .  donepezil (ARICEPT) 10 MG tablet, Take 10 mg by mouth daily. Patient not taking due to medication side effects (vivid, distressing dreams), Disp: , Rfl:  .  gabapentin (NEURONTIN) 100 MG capsule, Take 1 capsule (100 mg total) by mouth at bedtime., Disp: 90 capsule, Rfl: 3 .  lisinopril (PRINIVIL,ZESTRIL) 20 MG tablet, Take 20 mg by mouth every evening., Disp: , Rfl:  .  LORazepam (ATIVAN) 0.5 MG tablet, Take 0.5 mg by mouth every 8 (eight) hours as needed., Disp: , Rfl:  .  LORazepam (ATIVAN) 1 MG tablet, Take 1 mg by mouth daily as needed., Disp: , Rfl:  .  pantoprazole (PROTONIX) 40 MG tablet, Take 40 mg by mouth 2 (two) times daily., Disp: , Rfl:  .  sertraline (ZOLOFT) 100 MG tablet, Take 100 mg by mouth daily., Disp: , Rfl:   Clinical Interview:   The following information was obtained during a clinical interview with Frederick Watkins prior to cognitive testing.  Cognitive Symptoms: Decreased short-term memory: Endorsed. He reported largely generalized symptoms of short-term memory loss. Specific examples included occasionally entering a room and forgetting his original intention, as well as some trouble recalling previous conversations or events. Deficits were said to have been present for the past year or so but have seemed largely stable over time.  Decreased long-term memory: Denied. Decreased attention/concentration: Endorsed. Specifically, he reported  occasional difficulties with increased distractibility. However, he noted that these difficulties have seemed somewhat improved lately.  Reduced processing speed: Endorsed. Difficulties with executive functions: Largely denied. He did acknowledge difficulties surrounding complex planning/organization "somewhat." Trouble with indecision or impulsivity was denied.  Difficulties with emotion regulation: Endorsed. He acknowledged a recent history of increased irritability and agitation, stating that his default reaction was to be rather oppositional to his wife due to beliefs that she was trying to control him too tightly. However, over the past month, he described a conscious effort to improve his attitude which has led to noted improvements not only in his relationship with his wife, but also across his day-to-day functioning. He said that his wife has commented that he has seemed far more compassionate and understanding since this attitude shift.  Difficulties with receptive language: Endorsed. He described instances where his wife will have to repeat things several times while conversing with him due to him not understanding what she means.  Difficulties with word finding: Endorsed "here and there." Decreased visuoperceptual ability: Denied.  Difficulties completing ADLs: Denied.  Additional Medical History: History of traumatic brain injury/concussion: Denied. History of stroke: Denied. History of seizure activity: Denied. History of known exposure to toxins: Denied. Symptoms of chronic pain: Denied. Experience of frequent headaches/migraines: Denied. Frequent instances of dizziness/vertigo: Denied. However, he did describe occasional instances where he will experience some dizziness when standing up quickly or when getting out of bed in the morning.   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 Other sensory changes/difficulties (e.g.,  taste or smell) were denied.  Balance/coordination difficulties: Largely denied. He did describe his balance as "not  as good as it used to be," stating that he has been experiencing numbness/tingling sensations in his legs since this past June. These symptoms were attributed to neuropathy. He denied frequent falls or needing to use external sources for support while ambulating.  Other motor difficulties: Denied.  Sleep History: Estimated hours obtained each night: 7-8 hours. Sleep has been improved lately since he self-discontinued donepezil. This medication was said to have caused very vivid and distressing dreams.  Difficulties falling asleep: Denied. Difficulties staying asleep: Denied. Feels rested and refreshed upon awakening: Endorsed "most of the time."    History of snoring: Denied. History of waking up gasping for air: Denied. Witnessed breath cessation while asleep: Denied.  History of vivid dreaming: Denied. Excessive movement while asleep: Denied. Instances of acting out his dreams: Denied.  Psychiatric/Behavioral Health History: Depression: Denied. He described his mood as "exceptional," especially during the past month. He denied to his knowledge ever being previously diagnosed with a depressive disorder. Current or remote suicidal ideation, intent, or plan was denied.  Anxiety: Unclear. He did acknowledge being prescribed lorazepam several years prior to help with symptoms of anxiety. He described these sensations as feeling claustrophobic, almost as if the world was closing in on him. Currently, he denied significant anxiety symptoms and noted that medications seem to work well.  Mania: Denied. Trauma History: Denied. Visual/auditory hallucinations: Denied. Delusional thoughts: Denied.  Tobacco: Denied. Alcohol: He reported consuming 1-2 glasses of wine per night and denied a history of problematic alcohol abuse or dependence.  Recreational drugs: Denied. Caffeine: One  cup of coffee in the morning.   Family History: Problem Relation Age of Onset  . Dementia Mother        Unspecified type   This information was confirmed by Frederick Watkins.  Academic/Vocational History: Highest level of educational attainment: 15 years. He graduated from high school and reported completing an additional three years of college. He described himself as an average (mostly B/C) student in academic settings. Lavena Stanford represented a relative weakness.  History of developmental delay: Denied. History of grade repetition: Denied. Enrollment in special education courses: Denied. History of LD/ADHD: Denied.  Employment: Retired. 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   Evaluation Results:   Behavioral Observations: Frederick Watkins was unaccompanied, arrived to his appointment on time, and was appropriately dressed and groomed. He appeared alert and oriented. Observed gait and station were within normal limits. Gross motor functioning appeared intact upon informal observation and no abnormal movements (e.g., tremors) were noted. His affect was generally relaxed and positive, but did range appropriately given the subject being discussed during the clinical interview or the task at hand during testing procedures. Spontaneous speech was fluent and word finding difficulties were not observed during the clinical interview. Thought processes were coherent, organized, and normal in content. Insight into his cognitive difficulties appeared adequate. During testing, sustained attention was appropriate. Task engagement was adequate and he persisted when challenged. Overall, Frederick Watkins was cooperative with the clinical interview and subsequent testing procedures.   Adequacy of Effort: The validity of neuropsychological testing is limited by the extent to which the individual being tested may be assumed to have exerted adequate effort during testing.  Frederick Watkins expressed his intention to perform to the best of his abilities and exhibited adequate task engagement and persistence. Scores across stand-alone and embedded performance validity measures were within expectation. As such, the results of the current evaluation are believed to be a  valid representation of Frederick Watkins current cognitive functioning.  Test Results: Mr. Petite was fully oriented at the time of the current evaluation.  Intellectual abilities based upon educational and vocational attainment were estimated to be in the average range. Premorbid abilities were estimated to be within the average range based upon a single-word reading test.   Processing speed was above average. Basic attention was above average. More complex attention (e.g., working memory) was average. Executive functioning was variable but generally appropriate with scores in the below average to above average normative ranges. Performance on a task assessing safety/judgment was average.   Assessed receptive language abilities were above average. Likewise, Mr. Fabiano did not exhibit any difficulties comprehending task instructions and answered all questions asked of him appropriately. Assessed expressive language (e.g., verbal fluency and confrontation naming) was average to above average.     Assessed visuospatial/visuoconstructional abilities were above average.    Learning (i.e., encoding) of novel verbal and visual information was below average to average. Spontaneous delayed recall (i.e., retrieval) of previously learned information was also below average to average. Retention rates were 87% across a story learning task, 150% across a list learning task, and 100% across a shape learning task. Performance across recognition tasks was well below average across a shape learning task but average across all verbal memory tasks, suggesting evidence for information consolidation.   Results of emotional screening  instruments suggested that recent symptoms of generalized anxiety were in the mild range, while symptoms of depression were within normal limits. A screening instrument assessing recent sleep quality suggested the presence of minimal sleep dysfunction.  Tables of Scores:   Note: This summary of test scores accompanies the interpretive report and should not be considered in isolation without reference to the appropriate sections in the text. Descriptors are based on appropriate normative data and may be adjusted based on clinical judgment. The terms "impaired" and "within normal limits (WNL)" are used when a more specific level of functioning cannot be determined.       Effort Testing:   DESCRIPTOR       ACS Word Choice: --- --- Within Expectation    *Based on 74 y/o norms     Dot Counting Test: --- --- Within Expectation  NAB EVI: --- --- Within Expectation  D-KEFS Color Word Effort Index: --- --- Within Expectation       Orientation:      Raw Score Percentile   NAB Orientation, Form 1 29/29 --- ---       Cognitive Screening:           Raw Score Percentile   SLUMS: 24/30 --- ---       Intellectual Functioning:           Standard Score Percentile   Test of Premorbid Functioning: 98 45 Average       Memory:          NAB Memory Module, Form 1: Standard Score/ T Score Percentile   Total Memory Index 92 30 Average  List Learning       Total Trials 1-3 18/36 (47) 38 Average    List B 3/12 (46) 34 Average    Short Delay Free Recall 4/12 (40) 16 Below Average    Long Delay Free Recall 6/12 (50) 50 Average    Retention Percentage 150 (64) 92 Well Above Average    Recognition Discriminability 7 (53) 62 Average  Shape Learning       Total Trials 1-3 14/27 (51) 54  Average    Delayed Recall 5/9 (51) 54 Average    Retention Percentage 100 (49) 46 Average    Recognition Discriminability 3 (34) 5 Well Below Average  Story Learning       Immediate Recall 39/80 (37) 9 Below Average     Delayed Recall 20/40 (38) 12 Below Average    Retention Percentage 87 (51) 54 Average  Daily Living Memory       Immediate Recall 43/51 (57) 75 Above Average    Delayed Recall 13/17 (50) 50 Average    Retention Percentage 81 (47) 38 Average    Recognition Hits 8/10 (48) 42 Average       Attention/Executive Function:          Trail Making Test (TMT): Raw Score (T Score) Percentile     Part A 28 secs.,  0 errors (59) 82 Above Average    Part B 123 secs.,  6 errors (46) 34 Average         Scaled Score Percentile   WAIS-IV Coding: 13 84 Above Average       NAB Attention Module, Form 1: T Score Percentile     Digits Forward 62 88 Above Average    Digits Backwards 44 27 Average       D-KEFS Color-Word Interference Test: Raw Score (Scaled Score) Percentile     Color Naming 25 secs. (13) 84 Above Average    Word Reading 19 secs. (13) 84 Above Average    Inhibition 62 secs. (12) 75 Above Average      Total Errors 4 errors (9) 37 Average    Inhibition/Switching 62 secs. (12) 75 Above Average      Total Errors 0 errors (13) 84 Above Average       [employer] Test: Raw Score Percentile     Categories (trials) 1 (64) 11-16 Below Average    Total Errors 22 54 Average    Perseverative Errors 7 >99 Exceptionally High    Non-Perseverative Errors 15 14 Below Average    Failure to Maintain Set 3 --- ---       NAB Executive Functions Module, Form 1: T Score Percentile     Judgment 52 58 Average       Language:          Verbal Fluency Test: Raw Score (T Score) Percentile     Phonemic Fluency (FAS) 51 (62) 88 Above Average    Animal Fluency 19 (55) 69 Average        NAB Language Module, Form 1: T Score Percentile     Auditory Comprehension 58 79 Above Average    Naming 30/31 (59) 82 Above Average       Visuospatial/Visuoconstruction:      Raw Score Percentile   Clock Drawing: 9/10 --- Within Normal Limits       NAB Spatial Module, Form 1: T Score Percentile     Figure  Drawing Copy 57 75 Above Average        Scaled Score Percentile   WAIS-IV Block Design: 13 84 Above Average       Mood and Personality:      Raw Score Percentile   Geriatric Depression Scale: 5 --- Within Normal Limits  Geriatric Anxiety Scale: 17 --- Mild    Somatic 9 --- Mild    Cognitive 3 --- Mild    Affective 5 --- Mild       Additional Questionnaires:      Raw Score Percentile  PROMIS Sleep Disturbance Questionnaire: 20 --- None to Slight   Informed Consent and Coding/Compliance:   Mr. Innes was provided with a verbal description of the nature and purpose of the present neuropsychological evaluation. Also reviewed were the foreseeable risks and/or discomforts and benefits of the procedure, limits of confidentiality, and mandatory reporting requirements of this provider. The patient was given the opportunity to ask questions and receive answers about the evaluation. Oral consent to participate was provided by the patient.   This evaluation was conducted by Christia Reading, Ph.D., licensed clinical neuropsychologist. Mr. Scicchitano completed a comprehensive clinical interview with Dr. Melvyn Novas, billed as one unit [phone], and 140 minutes of cognitive testing and scoring, billed as one unit [phone] and four additional units [id number]. Psychometrist Cruzita Lederer, B.S., assisted Dr. Melvyn Novas with test administration and scoring procedures. As a separate and discrete service, Dr. Melvyn Novas spent a total of 110 minutes in interpretation and report writing billed as one unit [phone] and one unit [phone].

## 2020-04-25 NOTE — Progress Notes (Signed)
   Psychometrician Note   Cognitive testing was administered to Frederick Watkins by Cruzita Lederer, B.S. (psychometrist) under the supervision of Dr. Christia Reading, Ph.D., licensed psychologist on 04/25/2020. Frederick Watkins did not appear overtly distressed by the testing session per behavioral observation or responses across self-report questionnaires. Dr. Christia Reading, Ph.D. checked in with Frederick Watkins as needed to manage any distress related to testing procedures (if applicable). Rest breaks were offered.    The battery of tests administered was selected by Dr. Christia Reading, Ph.D. with consideration to Frederick Watkins current level of functioning, the nature of his symptoms, emotional and behavioral responses during interview, level of literacy, observed level of motivation/effort, and the nature of the referral question. This battery was communicated to the psychometrist. Communication between Dr. Christia Reading, Ph.D. and the psychometrist was ongoing throughout the evaluation and Dr. Christia Reading, Ph.D. was immediately accessible at all times. Dr. Christia Reading, Ph.D. provided supervision to the psychometrist on the date of this service to the extent necessary to assure the quality of all services provided.    Frederick Watkins will return within approximately 1-2 weeks for an interactive feedback session with Dr. Melvyn Novas at which time his test performances, clinical impressions, and treatment recommendations will be reviewed in detail. Frederick Watkins understands he can contact our office should he require our assistance before this time.  A total of 140 minutes of billable time were spent face-to-face with Frederick Watkins by the psychometrist. This includes both test administration and scoring time. Billing for these services is reflected in the clinical report generated by Dr. Christia Reading, Ph.D..  This note reflects time spent with the psychometrician and does not include test scores or any  clinical interpretations made by Dr. Melvyn Novas. The full report will follow in a separate note.

## 2020-04-30 ENCOUNTER — Encounter: Payer: Medicare Other | Admitting: Psychology

## 2020-05-08 ENCOUNTER — Ambulatory Visit: Payer: Medicare Other | Attending: Internal Medicine

## 2020-05-08 DIAGNOSIS — Z23 Encounter for immunization: Secondary | ICD-10-CM

## 2020-05-08 NOTE — Progress Notes (Signed)
   Covid-19 Vaccination Clinic  Name:  Frederick Watkins    MRN: [id number] DOB: 09/11/1945  05/08/2020  Frederick Watkins was observed post Covid-19 immunization for 15 minutes without incident. He was provided with Vaccine Information Sheet and instruction to access the V-Safe system.   Frederick Watkins was instructed to call 911 with any severe reactions post vaccine: Marland Kitchen Difficulty breathing  . Swelling of face and throat  . A fast heartbeat  . A bad rash all over body  . Dizziness and weakness

## 2020-05-09 ENCOUNTER — Encounter: Payer: Medicare Other | Admitting: Psychology

## 2020-05-17 ENCOUNTER — Other Ambulatory Visit: Payer: Self-pay

## 2020-05-17 ENCOUNTER — Ambulatory Visit (INDEPENDENT_AMBULATORY_CARE_PROVIDER_SITE_OTHER): Payer: Medicare Other | Admitting: Psychology

## 2020-05-17 DIAGNOSIS — I6381 Other cerebral infarction due to occlusion or stenosis of small artery: Secondary | ICD-10-CM

## 2020-05-17 DIAGNOSIS — F411 Generalized anxiety disorder: Secondary | ICD-10-CM

## 2020-05-17 NOTE — Progress Notes (Signed)
   Neuropsychology Feedback Session Frederick Watkins. [facility]  Reason for Referral:   Frederick Watkins a 74 y.o. right-handed Caucasian male referred by Ellouise Newer, M.D.,to characterize hiscurrent cognitive functioning and assist with diagnostic clarity and treatment planning in the context of subjective cognitive decline.   Feedback:   Frederick Watkins completed a comprehensive neuropsychological evaluation on 04/25/2020. Please refer to that encounter for the full report and recommendations. Briefly, results suggested neuropsychological functioning generally within appropriate normative ranges based upon premorbid intellectual estimates. Performance variability was exhibited across executive functioning with relative weaknesses seen across tasks assessing cognitive flexibility and pattern recognition/adaptability. Variability was also exhibited across memory tasks. Specifically, he exhibited a weakness learning novel story information; learning and retention scores across other tasks were appropriate. Patterns of relative weakness across testing are largely consistent with a primary vascular etiology. Frederick Watkins does have a history of a left basal ganglia lacunar infarct and several cardiovascular ailments, which would further be consistent with this presentation. However, I do not feel that current test performance warrants a formal diagnosis of a mild vascular neurocognitive disorder at the present time. Mild symptoms of acute anxiety reported across mood-related questionnaires could also influence cognitive functioning to a mild extent.  Frederick Watkins was unaccompanied during the current feedback session. Content of the current session focused on the results of his neuropsychological evaluation. Frederick Watkins was given the opportunity to ask questions and his questions were answered. He was encouraged to reach out should additional questions arise. A copy of his  report was provided at the conclusion of the visit.      17 minutes were spent conducting the current feedback session with Frederick Watkins, billed as one unit [phone].

## 2020-07-19 DIAGNOSIS — H905 Unspecified sensorineural hearing loss: Secondary | ICD-10-CM | POA: Diagnosis not present

## 2020-08-01 DIAGNOSIS — E7800 Pure hypercholesterolemia, unspecified: Secondary | ICD-10-CM | POA: Diagnosis not present

## 2020-08-01 DIAGNOSIS — I10 Essential (primary) hypertension: Secondary | ICD-10-CM | POA: Diagnosis not present

## 2020-08-01 DIAGNOSIS — G3184 Mild cognitive impairment, so stated: Secondary | ICD-10-CM | POA: Diagnosis not present

## 2020-10-08 ENCOUNTER — Other Ambulatory Visit: Payer: Self-pay

## 2020-10-08 ENCOUNTER — Encounter: Payer: Self-pay | Admitting: Neurology

## 2020-10-08 ENCOUNTER — Ambulatory Visit: Payer: Medicare Other | Admitting: Neurology

## 2020-10-08 VITALS — BP 111/70 | HR 80 | Ht 72.0 in | Wt 209.6 lb

## 2020-10-08 DIAGNOSIS — R413 Other amnesia: Secondary | ICD-10-CM | POA: Diagnosis not present

## 2020-10-08 DIAGNOSIS — F419 Anxiety disorder, unspecified: Secondary | ICD-10-CM | POA: Insufficient documentation

## 2020-10-08 DIAGNOSIS — G629 Polyneuropathy, unspecified: Secondary | ICD-10-CM

## 2020-10-08 DIAGNOSIS — G3184 Mild cognitive impairment, so stated: Secondary | ICD-10-CM | POA: Insufficient documentation

## 2020-10-08 MED ORDER — GABAPENTIN 100 MG PO CAPS: ORAL_CAPSULE | ORAL | 3 refills | Status: AC

## 2020-10-08 NOTE — Progress Notes (Signed)
Assessment/Plan:    Memory loss, Anxiety.  This is a pleasant 75 year old right-handed man with a history of hypertension, hyperlipidemia, CAD, right bundle branch block, history of paresthesias in both feet and with the diagnosis of memory loss and anxiety. The patient had been placed initially on donepezil by his PCP, but due to vivid, distressing dreams, he discontinued this in June 2021.  Currently he is not on any cognitive medications. He was first seen at our office in March 26, 2020, having undergone a neurocognitive evaluation in October 2021, yielding the diagnosis above.  Last SLUMS score was 27/30  02/2020. His TSH and B12 (also for neuropathy workup) were unremarkable.  MRI of the brain was negative for tumor, new stroke, or bleed.  It did show age-related changes, but no acute findings were noted.  His cognitive status and his overall performance is stable, with no new changes.   His neuropsych was normal, Dr. Melvyn Novas did not feel current test warrant diagnosis of MCI:  "However, I do not feel that current test performance warrants a formal diagnosis of a mild vascular neurocognitive disorder at the present time. Mild symptoms of acute anxiety reported across mood-related questionnaires could also influence cognitive functioning to a mild extent. Specific to memory, Frederick Watkins was largely able to learn novel verbal and visual information efficiently and retain this knowledge after lengthy delays. Overall, memory performance combined with intact performances across other areas of cognitive functioning is not suggestive of Alzheimer's disease. Likewise, his cognitive and behavioral profile is not suggestive of any other form of neurodegenerative illness presently."   Recommendations are as follows : . Continue to stay active exercising  at least 30 minutes at least 3 times a week.   Continue to eat Mediterranean diet  Follow up in 6 months   Case has been discussed with Dr. Delice Lesch,  who agrees with the plan.  Neuropathy, improving with gabapentin Increase gabapentin to 200 mg nightly  Follow-up labs with PCP  Mood changes Since last visit, Zoloft was increased to 150 mg daily, which has shown significant improvement in his mood. Continue current dose Recommend counseling to help him deal with daily issues, and couple relationships.  The patient is interested in pursuing this at his local church.   Subjective:   Frederick Watkins was seen today in follow up for memory loss.  He is alone during this visit.  My previous records as well as any outside records available were reviewed prior to today's visit.  He reports overall feeling well, although occasionally he feels that is slower at understanding.  He relates this more to his age than any other external issues.  He acknowledges that at times he becomes irritable, and frustrated, which affects the couple's relationship.  His wife, according to him, notices that after increasing Zoloft to 150 mg daily, his mood has improved.  He walks about 2 miles a day, and stretches about 20 minutes a day.  He reads books, and does crossword puzzles frequently.  He eats a healthy diet, vegetarian, with some Mediterranean component.He cooks, but does not leave the stove on according to him.  He sleeps about 7 hours, and feels rested upon waking up.  When he cannot sleep well he takes Benadryl as needed.  He denies any headaches, or head trauma.  He denies any dizziness, or vertigo.  He denies any vision changes.  The tingling in his lower extremities is improved since taking gabapentin.  He denies any trouble with  dressing up, or any other activities of daily living.  He denies living objects in unusual places.   He is eating well, and denies any constipation or diarrhea.  He denies any visual or auditory hallucinations.  He denies any paranoia.  He walks without difficulty, and does not need a walker or a cane.  He drives and has not gotten lost.   He occasionally drinks 1 glass of wine at night, or scotch, but denies any tobacco or recreational drug use.     Neurocognitive Evaluation by Hazle Coca, PhD, 04/25/20  "Frederick Watkins completed a comprehensive neuropsychological evaluation on 04/25/2020. Please refer to that encounter for the full report and recommendations. Briefly, results suggested neuropsychological functioning generally within appropriate normative ranges based upon premorbid intellectual estimates. Performance variability was exhibited across executive functioning with relative weaknesses seen across tasks assessing cognitive flexibility and pattern recognition/adaptability. Variability was also exhibited across memory tasks. Specifically, he exhibited a weakness learning novel story information; learning and retention scores across other tasks were appropriate. Patterns of relative weakness across testing are largely consistent with a primary vascular etiology. Frederick Watkins does have a history of a left basal ganglia lacunar infarct and several cardiovascular ailments, which would further be consistent with this presentation. However, I do not feel that current test performance warrants a formal diagnosis of a mild vascular neurocognitive disorder at the present time. Mild symptoms of acute anxiety reported across mood-related questionnaires could also influence cognitive functioning to a mild extent."   " INITIAL HISTORY OF PRESENT ILLNESS (03/21/20): This is a pleasant 75 year old right-handed man with a history of hypertension, hyperlipidemia, CAD, RBBB, depression, presenting for evaluation of memory loss and increasing agitation. He feels his memory is not as good as it used to be. He still feels confident, but his wife of 54 years tells him the he does not remember things from time to time. Memory changes started around a year ago. He denies getting lost driving. He denies missing medications. 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 He denies misplacing things frequently or leaving the stove on. His mother had dementia. He had a syncopal episode attributed to new medication in 2015 and fractured his nasal bone, no syncopal episodes since then. He drinks 1-2 glasses of wine nightly. SLUMS score 22/30 in April 2021 at PCP office. He was started on Donepezil, dose increased to 10mg  daily 2 months ago, no side effects.  He denies any headaches, dizziness, diplopia, dysarthria/dysphagia, neck/back pain, anosmia, or tremors. He has intermittent tingling in both feet, like they are asleep. They seem worse at night. He has some in his hands, but not as much. He notices a little weakness in his legs in the mornings when he gets up. He has constipation and some urinary incontinence (dribbling). He usually gets 5-6 hours of sleep, he is usually refreshed but tired some mornings. No daytime drowsiness. He has noticed increased irritability the past couple of years. His wife has noticed he does not talk as much as he used to. He tends to get upset at insignificant things. When he gets upset, he just wants to be alone and leaves the house, tending to isolate his wife for a day. This has increased in frequency the past 6 months. No paranoia or hallucinations. He has been on Sertraline 100mg  daily for the past 2 years. He has been on Lorazepam 0.5mg  1-2 times a day for anxiety, the extra dose also helps  him sleep at night. They try to eat healthy and exercise regularly. "     PREVIOUS MEDICATIONS: None  CURRENT MEDICATIONS:  Outpatient Encounter Medications as of 10/08/2020  Medication Sig  . Ascorbic Acid (VITAMIN C) 1000 MG tablet Take 1,000 mg by mouth 2 (two) times a day.  Marland Kitchen aspirin EC 81 MG tablet Take 81 mg by mouth daily.  Marland Kitchen atorvastatin (LIPITOR) 40 MG tablet Take 40 mg by mouth daily.  Marland Kitchen donepezil (ARICEPT) 10 MG tablet Take 10 mg by mouth daily. Patient not taking due to medication side  effects (vivid, distressing dreams)  . lisinopril (PRINIVIL,ZESTRIL) 20 MG tablet Take 20 mg by mouth every evening.  Marland Kitchen LORazepam (ATIVAN) 0.5 MG tablet Take 0.5 mg by mouth every 8 (eight) hours as needed.  . pantoprazole (PROTONIX) 40 MG tablet Take 40 mg by mouth 2 (two) times daily.  . [DISCONTINUED] gabapentin (NEURONTIN) 100 MG capsule Take 1 capsule (100 mg total) by mouth at bedtime.  . gabapentin (NEURONTIN) 100 MG capsule Take 2 capsules every night  . LORazepam (ATIVAN) 1 MG tablet Take 1 mg by mouth daily as needed. (Patient not taking: Reported on 10/08/2020)  . sertraline (ZOLOFT) 100 MG tablet Take 100 mg by mouth daily.   No facility-administered encounter medications on file as of 10/08/2020.     Objective:     PHYSICAL EXAMINATION:    VITALS:   Vitals:   10/08/20 1357  BP: 111/70  Pulse: 80  SpO2: 97%  Weight: 209 lb 9.6 oz (95.1 kg)  Height: 6' (1.829 m)    GEN:  The patient appears stated age and is in NAD. HEENT:  Normocephalic, atraumatic.   Neurological examination:  Orientation: The patient is alert. Oriented to person, place and date  Cranial nerves: There is good facial symmetry.The speech is fluent and clear.  Hearing is intact to conversational tone. Sensation: Sensation is intact to light touch throughout Motor: Strength is at least antigravity x4.  Movement examination: Tone: There is normal tone in the UE/LE Abnormal movements:  no tremor.  No myoclonus.  No asterixis.   Coordination:  There is no decremation with RAM's. Gait and Station: The patient has no difficulty arising out of a deep-seated chair without the use of the hands. The patient's stride length is good.    CBC    Component Value Date/Time   WBC 14.0 (H) 09/04/2013 0423   RBC 4.98 09/04/2013 0423   HGB 15.2 09/04/2013 0423   HCT 44.3 09/04/2013 0423   PLT 227 09/04/2013 0423   MCV 89.0 09/04/2013 0423   MCH 30.5 09/04/2013 0423   MCHC 34.3 09/04/2013 0423   RDW 14.3  09/04/2013 0423   LYMPHSABS 1.8 09/04/2013 0423   MONOABS 1.0 09/04/2013 0423   EOSABS 0.1 09/04/2013 0423   BASOSABS 0.0 09/04/2013 0423     CMP Latest Ref Rng & Units 05/04/2018 09/04/2013 05/31/2012  Glucose 70 - 99 mg/dL - 144(H) 90  BUN 6 - 23 mg/dL - 13 -  Creatinine 0.61 - 1.24 mg/dL 1.00 0.90 -  Sodium 137 - 147 mEq/L - 139 139  Potassium 3.7 - 5.3 mEq/L - 4.3 4.3  Chloride 96 - 112 mEq/L - 99 -  CO2 19 - 32 mEq/L - 26 -  Calcium 8.4 - 10.5 mg/dL - 8.9 -       Total time spent on today's visit was 45 minutes, including both face-to-face time and nonface-to-face time.  Time included that spent on  review of records (prior notes available to me/labs/imaging if pertinent), discussing treatment and goals, answering patient's questions and coordinating care.  Cc:  Shirline Frees, MD Sharene Butters, PA-C

## 2020-10-08 NOTE — Progress Notes (Signed)
Patient was seen, evaluated, and treatment plan was discussed with the Advanced Practice Provider. Neuropsychological evaluation normal, findings discussed with patient. Doing better with increase in Sertraline, advised medication in conjunction with counseling would be helpful for marital difficulties. Also discussed increasing Gabapentin to 200mg  qhs. I have also reviewed the orders written for this patient which were under my direction. I agree with the findings and the plan of care as documented by the Advanced Practice Provider.

## 2020-10-08 NOTE — Patient Instructions (Signed)
Good to see you!  1. Increase gabapentin 100mg : take 2 capsules every night  2. Consider seeing a counselor through your church  3. Follow-up in 6 months, call for any changes    RECOMMENDATIONS FOR ALL PATIENTS WITH MEMORY PROBLEMS: 1. Continue to exercise (Recommend 30 minutes of walking everyday, or 3 hours every week) 2. Increase social interactions - continue going to [place] and enjoy social gatherings with friends and family 3. Eat healthy, avoid fried foods and eat more fruits and vegetables 4. Maintain adequate blood pressure, blood sugar, and blood cholesterol level. Reducing the risk of stroke and cardiovascular disease also helps promoting better memory. 5. Avoid stressful situations. Live a simple life and avoid aggravations. Organize your time and prepare for the next day in anticipation. 6. Sleep well, avoid any interruptions of sleep and avoid any distractions in the bedroom that may interfere with adequate sleep quality 7. Avoid sugar, avoid sweets as there is a strong link between excessive sugar intake, diabetes, and cognitive impairment We discussed the Mediterranean diet, which has been shown to help patients reduce the risk of progressive memory disorders and reduces cardiovascular risk. This includes eating fish, eat fruits and green leafy vegetables, nuts like almonds and hazelnuts, walnuts, and also use olive oil. Avoid fast foods and fried foods as much as possible. Avoid sweets and sugar as sugar use has been linked to worsening of memory function.

## 2020-10-26 ENCOUNTER — Ambulatory Visit: Payer: Medicare Other | Admitting: Neurology

## 2021-02-06 DIAGNOSIS — G4700 Insomnia, unspecified: Secondary | ICD-10-CM | POA: Diagnosis not present

## 2021-02-06 DIAGNOSIS — I10 Essential (primary) hypertension: Secondary | ICD-10-CM | POA: Diagnosis not present

## 2021-04-11 ENCOUNTER — Ambulatory Visit: Payer: Medicare Other | Admitting: Physician Assistant

## 2021-04-11 ENCOUNTER — Other Ambulatory Visit: Payer: Self-pay

## 2021-04-11 ENCOUNTER — Encounter: Payer: Self-pay | Admitting: Physician Assistant

## 2021-04-11 VITALS — BP 150/91 | HR 66 | Resp 18 | Ht 72.0 in | Wt 210.0 lb

## 2021-04-11 DIAGNOSIS — R413 Other amnesia: Secondary | ICD-10-CM | POA: Diagnosis not present

## 2021-04-11 MED ORDER — RIVASTIGMINE TARTRATE 1.5 MG PO CAPS: ORAL_CAPSULE | ORAL | 30 refills | Status: DC

## 2021-04-11 NOTE — Progress Notes (Signed)
Assessment/Plan:   Memory Loss   This is a pleasant 75 year old r. The patient had been placed initially on donepezil by his PCP, but due to vivid, distressing dreams, he discontinued this in June 2021.  Currently he is not on any cognitive medications. He was first seen at our office in March 26, 2020, having undergone a neurocognitive evaluation in October 2021, yielding the diagnosis above.  Last SLUMS score was 27/30  02/2020. His TSH and B12 (also for neuropathy workup) were unremarkable.  MRI of the brain was negative for tumor, new stroke, or bleed.  It did show age-related changes, but no acute findings were noted.  His cognitive status and his overall performance is stable, with no new changes. His neuropsych evaluation was normal, Dr. Melvyn Novas did not feel current test warrant diagnosis of MCI. Overall, memory performance combined with intact performances across other areas of cognitive functioning is not suggestive of Alzheimer's disease. Likewise, his cognitive and behavioral profile is not suggestive of any other form of neurodegenerative illness presently  Recommendations are as follows : Continue to stay active exercising  at least 30 minutes at least 3 times a week.  Continue to eat Mediterranean diet Follow up in 6 months Continue gabapentin to 200 mg nightly  Continue Zoloft  100 mg daily   Subjective:   CHIRON CAMPIONE is a 75 yo right-handed man with a history of hypertension, hyperlipidemia, CAD, right bundle branch block, history of paresthesias in both feet and with the diagnosis of memory loss and anxiety.  He is alone during this visit.  My previous records as well as any outside records available were reviewed prior to today's visit.  He reports overall feeling well, although occasionally he feels that is slower at understanding and retaining information, trying to increase word finding and crossword puzzles. He is on Zoloft 100 mg daily, which helps with his mood.  He is  also making an effort to be more patient with his spouse, trying to increase conversation, be more sensitive to her knees especially now that she has been diagnosed with ulcerative colitis, and is on Creon.  The patient used to be on donepezil, but he discontinued due to very vivid dreams, and has remained stable from the memory standpoint.  He likes to walk about 2 miles a day, stretching about 20 minutes a day.  He continues to be on a healthy diet, vegetarian, with some Mediterranean component.  He cooks and denies leaving the stove on.  His appetite is good and denies any dysphagia.  He sleeps about 7 hours, feeling rested when waking up.  If able to sleep well, he takes Benadryl as needed.  He denies any headaches, or head trauma.  He denies any dizziness, or vertigo.  He denies any vision changes.  The tingling in his lower extremities is improved since taking gabapentin.  He denies any issues with bathing and dressing.  He denies living objects in unusual places.  He denies any hallucinations or paranoia.  When ambulating he does not use a walker or a cane, he walks without difficulty.  He continues to drive without getting lost.  He continues to have mild constipation and some urinary incontinence.    Neurocognitive Evaluation by Hazle Coca, PhD, 04/25/20 "Mr. Gullion completed a comprehensive neuropsychological evaluation on 04/25/2020. Please refer to that encounter for the full report and recommendations. Briefly, results suggested neuropsychological functioning generally within appropriate normative ranges based upon premorbid intellectual estimates. Performance variability was exhibited  across executive functioning with relative weaknesses seen across tasks assessing cognitive flexibility and pattern recognition/adaptability. Variability was also exhibited across memory tasks. Specifically, he exhibited a weakness learning novel story information; learning and retention scores across other tasks  were appropriate. Patterns of relative weakness across testing are largely consistent with a primary vascular etiology. Mr. Odonoghue does have a history of a left basal ganglia lacunar infarct and several cardiovascular ailments, which would further be consistent with this presentation. However, I do not feel that current test performance warrants a formal diagnosis of a mild vascular neurocognitive disorder at the present time. Mild symptoms of acute anxiety reported across mood-related questionnaires could also influence cognitive functioning to a mild extent."   INITIAL HISTORY OF PRESENT ILLNESS (03/21/20):This is a pleasant 75 year old right-handed man with a history of hypertension, hyperlipidemia, CAD, RBBB, depression, presenting for evaluation of memory loss and increasing agitation. He feels his memory is not as good as it used to be. He still feels confident, but his wife of 56 years tells him the he does not remember things from time to time. Memory changes started around a year ago. He denies getting lost driving. He denies missing medications. 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 He denies misplacing things frequently or leaving the stove on. His mother had dementia. He had a syncopal episode attributed to new medication in 2015 and fractured his nasal bone, no syncopal episodes since then. He drinks 1-2 glasses of wine nightly. SLUMS score 22/30 in April 2021 at PCP office. He was started on Donepezil, dose increased to 10mg  daily 2 months ago, no side effects.   He denies any headaches, dizziness, diplopia, dysarthria/dysphagia, neck/back pain, anosmia, or tremors. He has intermittent tingling in both feet, like they are asleep. They seem worse at night. He has some in his hands, but not as much. He notices a little weakness in his legs in the mornings when he gets up. He has constipation and some urinary incontinence (dribbling). He usually gets 5-6 hours  of sleep, he is usually refreshed but tired some mornings. No daytime drowsiness. He has noticed increased irritability the past couple of years. His wife has noticed he does not talk as much as he used to. He tends to get upset at insignificant things. When he gets upset, he just wants to be alone and leaves the house, tending to isolate his wife for a day. This has increased in frequency the past 6 months. No paranoia or hallucinations. He has been on Sertraline 100mg  daily for the past 2 years. He has been on Lorazepam 0.5mg  1-2 times a day for anxiety, the extra dose also helps him sleep at night. They try to eat healthy and exercise regularly. " -      PREVIOUS MEDICATIONS: None  CURRENT MEDICATIONS:  Outpatient Encounter Medications as of 04/11/2021  Medication Sig   Ascorbic Acid (VITAMIN C) 1000 MG tablet Take 1,000 mg by mouth 2 (two) times a day.   aspirin EC 81 MG tablet Take 81 mg by mouth daily.   atorvastatin (LIPITOR) 40 MG tablet Take 40 mg by mouth daily.   gabapentin (NEURONTIN) 100 MG capsule Take 2 capsules every night   lisinopril (PRINIVIL,ZESTRIL) 20 MG tablet Take 20 mg by mouth every evening.   pantoprazole (PROTONIX) 40 MG tablet Take 40 mg by mouth 2 (two) times daily.   sertraline (ZOLOFT) 100 MG tablet Take 100 mg by mouth daily.  LORazepam (ATIVAN) 0.5 MG tablet Take 0.5 mg by mouth every 8 (eight) hours as needed.   LORazepam (ATIVAN) 1 MG tablet Take 1 mg by mouth daily as needed.   [DISCONTINUED] donepezil (ARICEPT) 10 MG tablet Take 10 mg by mouth daily. Patient not taking due to medication side effects (vivid, distressing dreams)   No facility-administered encounter medications on file as of 04/11/2021.     Objective:     PHYSICAL EXAMINATION:    VITALS:   Vitals:   04/11/21 1306  BP: (!) 150/91  Pulse: 66  Resp: 18  SpO2: 97%  Weight: 210 lb (95.3 kg)  Height: 6' (1.829 m)     GEN:  The patient appears stated age and is in NAD. HEENT:   Normocephalic, atraumatic.   Neurological examination:  Orientation: The patient is alert. Oriented to person, place and date  Cranial nerves: There is good facial symmetry.The speech is fluent and clear.  Hearing is intact to conversational tone. Sensation: Sensation is intact to light touch throughout Motor: Strength is at least antigravity x4.  Movement examination: Tone: There is normal tone in the UE/LE Abnormal movements:  no tremor.  No myoclonus.  No asterixis.   Coordination:  There is no decremation with RAM's. Gait and Station: The patient has no difficulty arising out of a deep-seated chair without the use of the hands. The patient's stride length is good.    CBC    Component Value Date/Time   WBC 14.0 (H) 09/04/2013 0423   RBC 4.98 09/04/2013 0423   HGB 15.2 09/04/2013 0423   HCT 44.3 09/04/2013 0423   PLT 227 09/04/2013 0423   MCV 89.0 09/04/2013 0423   MCH 30.5 09/04/2013 0423   MCHC 34.3 09/04/2013 0423   RDW 14.3 09/04/2013 0423   LYMPHSABS 1.8 09/04/2013 0423   MONOABS 1.0 09/04/2013 0423   EOSABS 0.1 09/04/2013 0423   BASOSABS 0.0 09/04/2013 0423     CMP Latest Ref Rng & Units 05/04/2018 09/04/2013 05/31/2012  Glucose 70 - 99 mg/dL - 144(H) 90  BUN 6 - 23 mg/dL - 13 -  Creatinine 0.61 - 1.24 mg/dL 1.00 0.90 -  Sodium 137 - 147 mEq/L - 139 139  Potassium 3.7 - 5.3 mEq/L - 4.3 4.3  Chloride 96 - 112 mEq/L - 99 -  CO2 19 - 32 mEq/L - 26 -  Calcium 8.4 - 10.5 mg/dL - 8.9 -       Total time spent on today's visit was 23 minutes, including both face-to-face time and nonface-to-face time.  Time included that spent on review of records (prior notes available to me/labs/imaging if pertinent), discussing treatment and goals, answering patient's questions and coordinating care.  Cc:  Shirline Frees, MD Sharene Butters, PA-C

## 2021-04-11 NOTE — Patient Instructions (Addendum)
It was a pleasure to see you today at our office.   Recommendations:  Follow up in  6 months   RECOMMENDATIONS FOR ALL PATIENTS WITH MEMORY PROBLEMS: 1. Continue to exercise (Recommend 30 minutes of walking everyday, or 3 hours every week) 2. Increase social interactions - continue going to [place] and enjoy social gatherings with friends and family 3. Eat healthy, avoid fried foods and eat more fruits and vegetables 4. Maintain adequate blood pressure, blood sugar, and blood cholesterol level. Reducing the risk of stroke and cardiovascular disease also helps promoting better memory. 5. Avoid stressful situations. Live a simple life and avoid aggravations. Organize your time and prepare for the next day in anticipation. 6. Sleep well, avoid any interruptions of sleep and avoid any distractions in the bedroom that may interfere with adequate sleep quality 7. Avoid sugar, avoid sweets as there is a strong link between excessive sugar intake, diabetes, and cognitive impairment We discussed the Mediterranean diet, which has been shown to help patients reduce the risk of progressive memory disorders and reduces cardiovascular risk. This includes eating fish, eat fruits and green leafy vegetables, nuts like almonds and hazelnuts, walnuts, and also use olive oil. Avoid fast foods and fried foods as much as possible. Avoid sweets and sugar as sugar use has been linked to worsening of memory function.  There is always a concern of gradual progression of memory problems. If this is the case, then we may need to adjust level of care according to patient needs. Support, both to the patient and caregiver, should then be put into place.    FALL PRECAUTIONS: Be cautious when walking. Scan the area for obstacles that may increase the risk of trips and falls. When getting up in the mornings, sit up at the edge of the bed for a few minutes before getting out of bed. Consider elevating the bed at the head end to  avoid drop of blood pressure when getting up. Walk always in a well-lit room (use night lights in the walls). Avoid area rugs or power cords from appliances in the middle of the walkways. Use a walker or a cane if necessary and consider physical therapy for balance exercise. Get your eyesight checked regularly.  FINANCIAL OVERSIGHT: Supervision, especially oversight when making financial decisions or transactions is also recommended.  HOME SAFETY: Consider the safety of the kitchen when operating appliances like stoves, microwave oven, and blender. Consider having supervision and share cooking responsibilities until no longer able to participate in those. Accidents with firearms and other hazards in the house should be identified and addressed as well.   ABILITY TO BE LEFT ALONE: If patient is unable to contact 911 operator, consider using LifeLine, or when the need is there, arrange for someone to stay with patients. Smoking is a fire hazard, consider supervision or cessation. Risk of wandering should be assessed by caregiver and if detected at any point, supervision and safe proof recommendations should be instituted.  MEDICATION SUPERVISION: Inability to self-administer medication needs to be constantly addressed. Implement a mechanism to ensure safe administration of the medications.   DRIVING: Regarding driving, in patients with progressive memory problems, driving will be impaired. We advise to have someone else do the driving if trouble finding directions or if minor accidents are reported. Independent driving assessment is available to determine safety of driving.   If you are interested in the driving assessment, you can contact the following:  [facility] [employer] in [place]  [facility] [phone]  [facility] [phone]  [facility] [phone] or [phone]

## 2021-04-15 ENCOUNTER — Ambulatory Visit: Payer: Medicare Other | Admitting: Cardiology

## 2021-04-15 ENCOUNTER — Other Ambulatory Visit: Payer: Self-pay

## 2021-04-15 ENCOUNTER — Encounter: Payer: Self-pay | Admitting: Cardiology

## 2021-04-15 DIAGNOSIS — I4510 Unspecified right bundle-branch block: Secondary | ICD-10-CM

## 2021-04-15 DIAGNOSIS — I2510 Atherosclerotic heart disease of native coronary artery without angina pectoris: Secondary | ICD-10-CM | POA: Diagnosis not present

## 2021-04-15 DIAGNOSIS — E782 Mixed hyperlipidemia: Secondary | ICD-10-CM | POA: Diagnosis not present

## 2021-04-15 DIAGNOSIS — G3184 Mild cognitive impairment, so stated: Secondary | ICD-10-CM | POA: Diagnosis not present

## 2021-04-15 DIAGNOSIS — I10 Essential (primary) hypertension: Secondary | ICD-10-CM | POA: Diagnosis not present

## 2021-04-15 NOTE — Assessment & Plan Note (Signed)
No high risk symptoms such as syncope.

## 2021-04-15 NOTE — Assessment & Plan Note (Signed)
Overall well controlled on average.  Sometimes elevated in the doctor's office.

## 2021-04-15 NOTE — Patient Instructions (Signed)
Medication Instructions:  Your physician recommends that you continue on your current medications as directed. Please refer to the Current Medication list given to you today.  *If you need a refill on your cardiac medications before your next appointment, please call your pharmacy*   Lab Work: None If you have labs (blood work) drawn today and your tests are completely normal, you will receive your results only by: [place] (if you have MyChart) OR A paper copy in the mail If you have any lab test that is abnormal or we need to change your treatment, we will call you to review the results.   Follow-Up: At [facility], you and your health needs are our priority.  As part of our continuing mission to provide you with exceptional heart care, we have created designated Provider Care Teams.  These Care Teams include your primary Cardiologist (physician) and Advanced Practice Providers (APPs -  Physician Assistants and Nurse Practitioners) who all work together to provide you with the care you need, when you need it.  Your next appointment:   1 year(s)  The format for your next appointment:   In Person  Provider:   Candee Furbish, MD

## 2021-04-15 NOTE — Assessment & Plan Note (Signed)
Calcified plaque from calcium score.  Negative nuclear stress test 2017.  Continue with aggressive secondary risk factor prevention, low-dose aspirin, atorvastatin.

## 2021-04-15 NOTE — Assessment & Plan Note (Signed)
Currently good control.  LDL 47 from outside labs.  Change to atorvastatin from simvastatin and doing better.  No myalgias.

## 2021-04-15 NOTE — Assessment & Plan Note (Signed)
Notes from outside MD reviewed.

## 2021-04-15 NOTE — Progress Notes (Signed)
Cardiology Office Note:    Date:  04/15/2021   ID:  Frederick Watkins, DOB 01-01-1946, MRN [id number]  PCP:  Shirline Frees, MD   [facility] Providers Cardiologist:  Candee Furbish, MD     Referring MD: Shirline Frees, MD  History of Present Illness:    Frederick Watkins is a 75 y.o. male here fo CAD follow up. RBBB HTN HL.   Former patient of Dr. [name].  Overall he is doing quite well without any fevers chills nausea vomiting syncope bleeding.  Has an occasional twinge of chest discomfort likely musculoskeletal.  No significant shortness of breath.  In the past has had problems with Crestor causing tongue swelling   Former Physiological scientist at [employer].  GrandDaughter, [employer], [place] horn.  Now teaches music.  Just married.    Coronary artery calcification with negative nuclear stress test many years ago.   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  Former patient of Dr. [name].   Former Physiological scientist.  Daughter, [employer] band   Coronary artery calcification with negative nuclear stress test many years ago.   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  Past Medical History:  Diagnosis Date   Coronary artery calcification 12/06/2018   Coronary artery disease involving native coronary artery of native heart without angina pectoris 12/06/2018   Essential hypertension 12/06/2018   GERD (gastroesophageal reflux disease)    Lacunar infarct    Left basal ganglia   Mixed hyperlipidemia 12/06/2018   Mucous cyst of finger    Left thumb   Right bundle branch block 12/06/2018   RLS (restless legs syndrome)     Past Surgical History:  Procedure Laterality Date   EAR CYST EXCISION  05/31/2012   Procedure: CYST REMOVAL;  Surgeon: [name], MD;  Location[facility];  Service: Orthopedics;  Laterality: Left;  Left Thumb Mucous Cyst Excision   RETINAL DETACHMENT REPAIR W/ SCLERAL BUCKLE LE  SEPT 2013   W/ CATARACT EXTRACTION WITH LENS IMPLANT ,  RIGHT EYE    Current Medications: Current Meds  Medication Sig   Ascorbic Acid (VITAMIN C) 1000 MG tablet Take 1,000 mg by mouth 2 (two) times a day.   aspirin EC 81 MG tablet Take 81 mg by mouth daily.   atorvastatin (LIPITOR) 40 MG tablet Take 40 mg by mouth daily.   gabapentin (NEURONTIN) 100 MG capsule Take 2 capsules every night   lisinopril (PRINIVIL,ZESTRIL) 20 MG tablet Take 20 mg by mouth every evening.   LORazepam (ATIVAN) 1 MG tablet Take 1 mg by mouth daily as needed.   pantoprazole (PROTONIX) 40 MG tablet Take 40 mg by mouth 2 (two) times daily.   sertraline (ZOLOFT) 100 MG tablet Take 100 mg by mouth daily.     Allergies:   Crestor [rosuvastatin]   Social History   Socioeconomic History   Marital status: Married    Spouse name: Not on file   Number of children: Not on file   Years of education: 15   Highest education level: Some college, no degree  Occupational History   Occupation: Retired  Tobacco Use   Smoking status: Never   Smokeless tobacco: Never  Vaping Use   Vaping Use: Never used  Substance and Sexual Activity   Alcohol use: Yes    Alcohol/week: 7.0 standard drinks    Types: 7 Glasses of wine per week   Drug use: No   Sexual activity: Not on file  Other Topics Concern  Not on file  Social History Narrative   Right handed    Lives with wife    Social Determinants of Health   Financial Resource Strain: Not on file  Food Insecurity: Not on file  Transportation Needs: Not on file  Physical Activity: Not on file  Stress: Not on file  Social Connections: Not on file     Family History: The patient's family history includes Dementia in his mother.  ROS:   Please see the history of present illness.     All other systems reviewed and are negative.  EKGs/Labs/Other Studies Reviewed:    EKG:  EKG is  ordered today.  The ekg ordered today demonstrates SR 68 RBBB  Recent Labs: No results found for requested labs within last 8760 hours.   Recent Lipid Panel No results found for: CHOL, TRIG, HDL, CHOLHDL, VLDL, LDLCALC, LDLDIRECT  LDL 88 triglycerides 106 hemoglobin 15.7 creatinine 0.9 potassium 4.9 ALT 29 TSH 1.9 from outside labs  Risk Assessment/Calculations:          Physical Exam:    VS:  BP 130/80 (BP Location: Left Arm, Patient Position: Sitting, Cuff Size: Normal)   Pulse 68   Ht 6' (1.829 m)   Wt 209 lb (94.8 kg)   SpO2 96%   BMI 28.35 kg/m     Wt Readings from Last 3 Encounters:  04/15/21 209 lb (94.8 kg)  04/11/21 210 lb (95.3 kg)  10/08/20 209 lb 9.6 oz (95.1 kg)     GEN:  Well nourished, well developed in no acute distress HEENT: Normal NECK: No JVD; No carotid bruits LYMPHATICS: No lymphadenopathy CARDIAC: RRR, no murmurs, rubs, gallops RESPIRATORY:  Clear to auscultation without rales, wheezing or rhonchi  ABDOMEN: Soft, non-tender, non-distended MUSCULOSKELETAL:  No edema; No deformity  SKIN: Warm and dry NEUROLOGIC:  Alert and oriented x 3 PSYCHIATRIC:  Normal affect   ASSESSMENT:    1. Coronary artery disease involving native coronary artery of native heart without angina pectoris   2. Right bundle branch block   3. Essential hypertension   4. Mixed hyperlipidemia   5. Mild cognitive impairment    PLAN:    In order of problems listed above: Coronary artery disease involving native coronary artery of native heart without angina pectoris Calcified plaque from calcium score.  Negative nuclear stress test 2017.  Continue with aggressive secondary risk factor prevention, low-dose aspirin, atorvastatin.  Right bundle branch block No high risk symptoms such as syncope.  Essential hypertension Overall well controlled on average.  Sometimes elevated in the doctor's office.  Mixed hyperlipidemia Currently good control.  LDL 47 from outside labs.  Change to atorvastatin from simvastatin and doing better.  No myalgias.  Mild cognitive impairment Notes from outside MD reviewed.        Medication Adjustments/Labs and Tests Ordered: Current medicines are reviewed at length with the patient today.  Concerns regarding medicines are outlined above.  Orders Placed This Encounter  Procedures   EKG 12-Lead    No orders of the defined types were placed in this encounter.   Patient Instructions  Medication Instructions:  Your physician recommends that you continue on your current medications as directed. Please refer to the Current Medication list given to you today.  *If you need a refill on your cardiac medications before your next appointment, please call your pharmacy*   Lab Work: None If you have labs (blood work) drawn today and your tests are completely normal, you will receive your  results only by: MyChart Message (if you have MyChart) OR A paper copy in the mail If you have any lab test that is abnormal or we need to change your treatment, we will call you to review the results.   Follow-Up: At [facility], you and your health needs are our priority.  As part of our continuing mission to provide you with exceptional heart care, we have created designated Provider Care Teams.  These Care Teams include your primary Cardiologist (physician) and Advanced Practice Providers (APPs -  Physician Assistants and Nurse Practitioners) who all work together to provide you with the care you need, when you need it.  Your next appointment:   1 year(s)  The format for your next appointment:   In Person  Provider:   Candee Furbish, MD      Signed, Candee Furbish, MD  04/15/2021 8:56 AM    [place]

## 2021-05-06 IMAGING — MR MR HEAD W/O CM
10 series · 48 of 48 positions shown · non-contrast
Comparison: 09/04/2013 head CT.

CLINICAL DATA: Memory loss

EXAM:
MRI HEAD WITHOUT CONTRAST
TECHNIQUE: Multiplanar, multiecho pulse sequences of the brain and surrounding
structures were obtained without intravenous contrast.

[Series 2: t1_se_sag · sagittal · 5.0mm · 0.47mm/px · 2 of 26 slices shown]
[im 1/26]
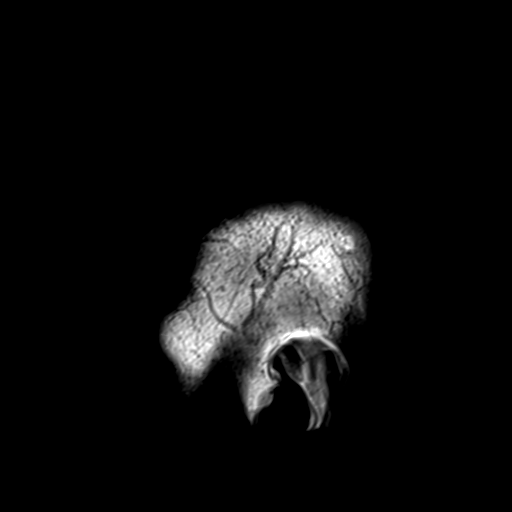
[im 26/26]
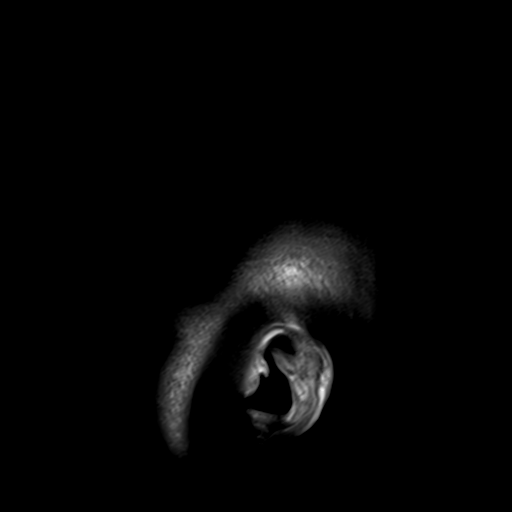

[Series 3: ep2d_diff_3 · axial · 3.0mm · 1.80mm/px · z∈[-52,+93]mm · 8 of 100 slices shown]
[im 1/100]
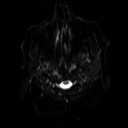
[im 15/100]
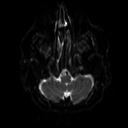
[im 29/100]
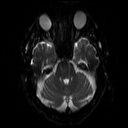
[im 43/100]
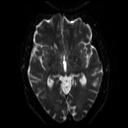
[im 57/100]
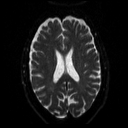
[im 71/100]
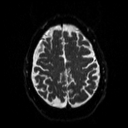
[im 85/100]
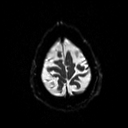
[im 100/100]
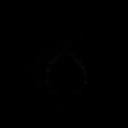

[Series 4: ep2d_diff_3_adc · axial · 3.0mm · 1.80mm/px · z∈[-52,+93]mm · 4 of 49 slices shown]
[im 1/49]
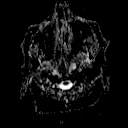
[im 17/49]
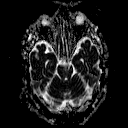
[im 33/49]
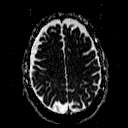
[im 49/49]
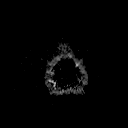

[Series 5: ep2d_diff_cor · coronal · 5.0mm · 1.77mm/px · 5 of 58 slices shown]
[im 1/58]
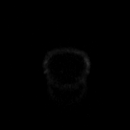
[im 15/58]
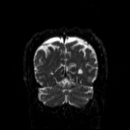
[im 29/58]
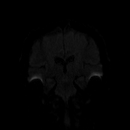
[im 43/58]
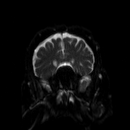
[im 58/58]
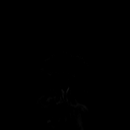

[Series 6: ep2d_diff_cor_adc · coronal · 5.0mm · 1.77mm/px · 2 of 30 slices shown]
[im 1/30]
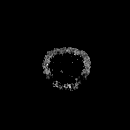
[im 30/30]
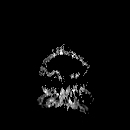

[Series 8: swi_images · axial · 2.0mm · 0.90mm/px · z∈[-53,+99]mm · 6 of 80 slices shown]
[im 1/80]
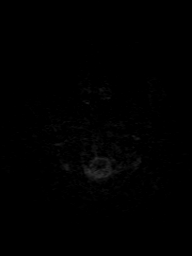
[im 16/80]
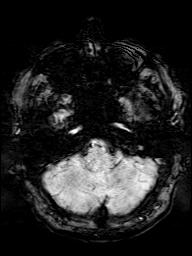
[im 32/80]
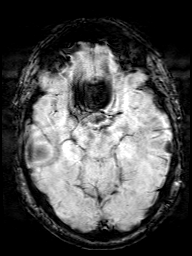
[im 48/80]
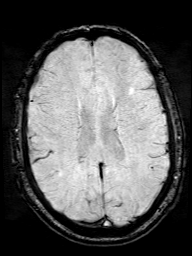
[im 64/80]
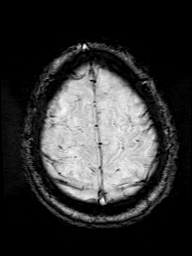
[im 80/80]
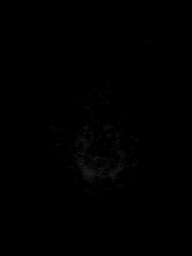

[Series 9: FLAIR · axial · 3.0mm · 0.43mm/px · z∈[-61,+102]mm · 2 of 29 slices shown]
[im 1/29]
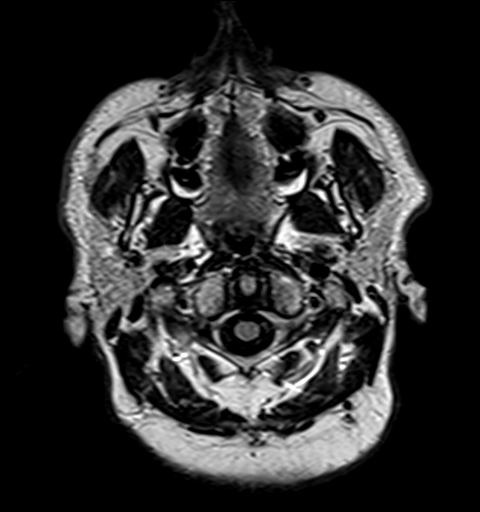
[im 29/29]
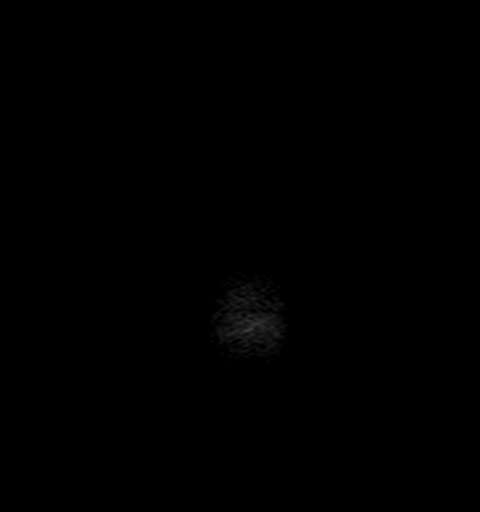

[Series 10: t2_tse_tra_512 · axial · 5.0mm · 0.60mm/px · z∈[-53,+98]mm · 2 of 27 slices shown]
[im 1/27]
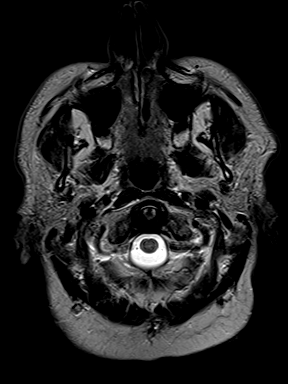
[im 27/27]
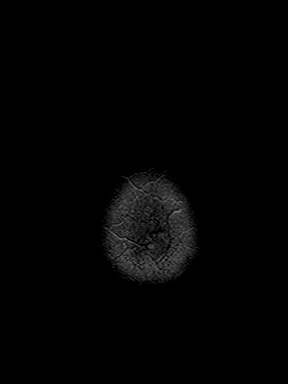

[Series 11: t1_mpr_tra · axial · 1.0mm · 0.72mm/px · z∈[-62,+107]mm · 14 of 176 slices shown]
[im 1/176]
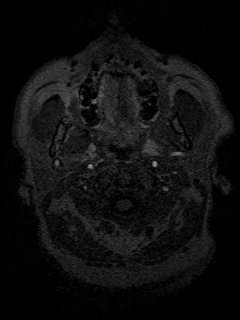
[im 14/176]
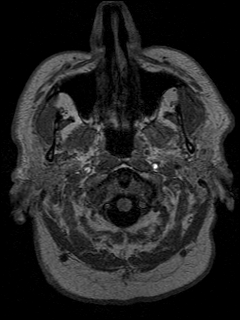
[im 27/176]
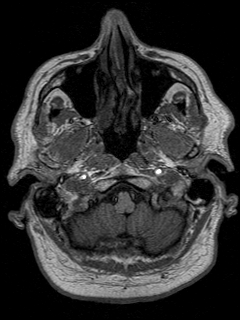
[im 41/176]
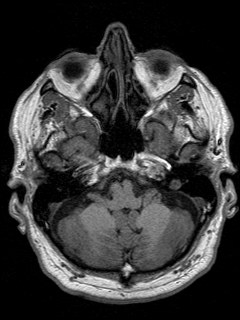
[im 54/176]
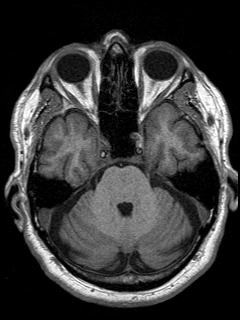
[im 68/176]
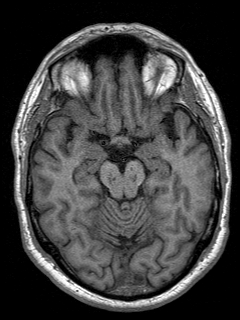
[im 81/176]
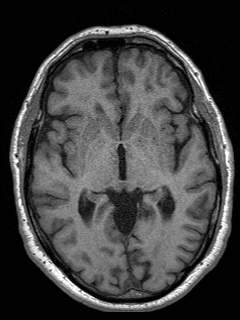
[im 95/176]
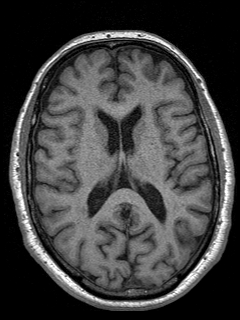
[im 108/176]
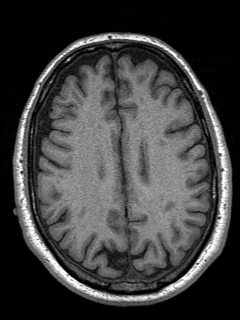
[im 122/176]
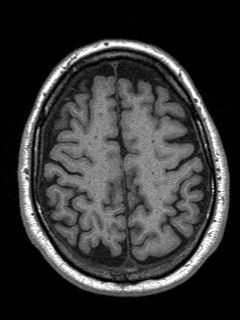
[im 135/176]
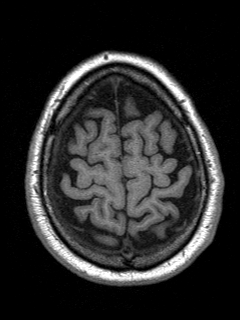
[im 149/176]
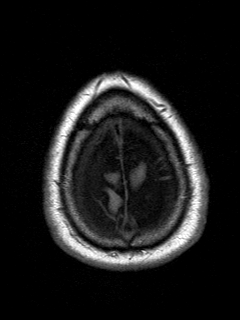
[im 162/176]
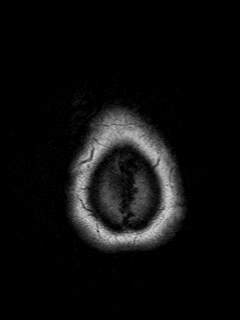
[im 176/176]
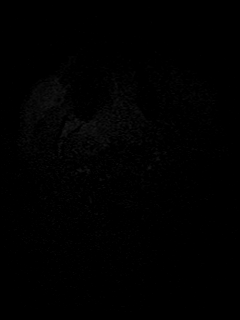

[Series 12: T2 · coronal · 5.0mm · 0.45mm/px · 3 of 32 slices shown]
[im 1/32]
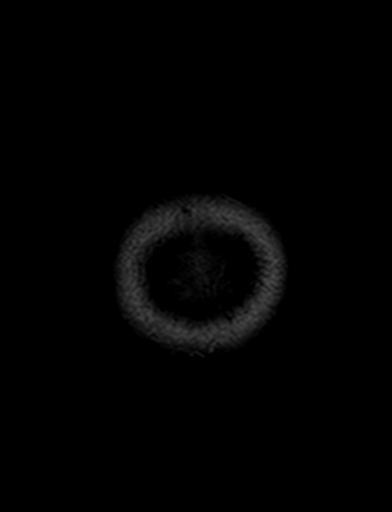
[im 16/32]
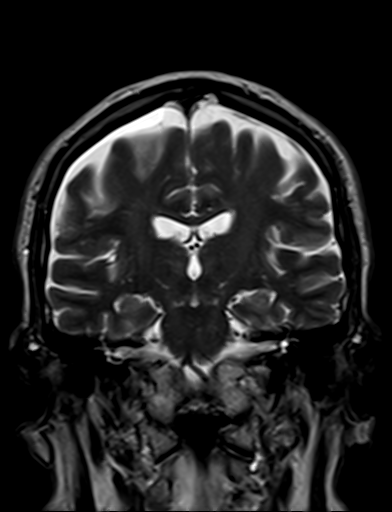
[im 32/32]
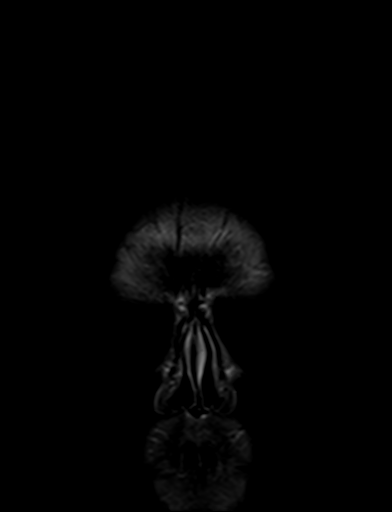

[48 of 48 positions shown; findings below may reference images not displayed]

FINDINGS: Brain: No diffusion-weighted signal abnormality. No intracranial
hemorrhage. No midline shift, ventriculomegaly or extra-axial fluid
collection. No mass lesion. Mild cerebral atrophy with ex vacuo
dilatation. Left basal ganglia lacunar insult. Mild chronic
microvascular ischemic changes. The bilateral hippocampi are
symmetric demonstrating normal morphology and signal intensity.

Vascular: Normal flow voids. Right frontal developmental venous
anomaly.

Skull and upper cervical spine: Normal marrow signal.

Sinuses/Orbits: Sequela of bilateral lens replacement. Mild ethmoid
sinus mucosal thickening. No mastoid effusion.

Other: None.
IMPRESSION: No acute intracranial process.  Remote left basal ganglia insult.

Mild cerebral atrophy and mild chronic microvascular ischemic
changes.

## 2021-06-04 DIAGNOSIS — Z23 Encounter for immunization: Secondary | ICD-10-CM | POA: Diagnosis not present

## 2021-07-23 DIAGNOSIS — M25561 Pain in right knee: Secondary | ICD-10-CM | POA: Diagnosis not present

## 2021-07-23 DIAGNOSIS — M1711 Unilateral primary osteoarthritis, right knee: Secondary | ICD-10-CM | POA: Diagnosis not present

## 2021-09-20 DIAGNOSIS — E7800 Pure hypercholesterolemia, unspecified: Secondary | ICD-10-CM | POA: Diagnosis not present

## 2021-09-20 DIAGNOSIS — I10 Essential (primary) hypertension: Secondary | ICD-10-CM | POA: Diagnosis not present

## 2021-09-20 DIAGNOSIS — G4700 Insomnia, unspecified: Secondary | ICD-10-CM | POA: Diagnosis not present

## 2021-09-20 DIAGNOSIS — Z0000 Encounter for general adult medical examination without abnormal findings: Secondary | ICD-10-CM | POA: Diagnosis not present

## 2021-10-12 ENCOUNTER — Encounter (HOSPITAL_BASED_OUTPATIENT_CLINIC_OR_DEPARTMENT_OTHER): Payer: Self-pay

## 2021-10-12 ENCOUNTER — Emergency Department (HOSPITAL_BASED_OUTPATIENT_CLINIC_OR_DEPARTMENT_OTHER)
Admission: EM | Admit: 2021-10-12 | Discharge: 2021-10-12 | Disposition: A | Discharge status: Home / Self Care | Payer: Medicare Other | Attending: Emergency Medicine | Admitting: Emergency Medicine

## 2021-10-12 DIAGNOSIS — Z20822 Contact with and (suspected) exposure to covid-19: Secondary | ICD-10-CM | POA: Insufficient documentation

## 2021-10-12 DIAGNOSIS — Z7982 Long term (current) use of aspirin: Secondary | ICD-10-CM | POA: Diagnosis not present

## 2021-10-12 LAB — BASIC METABOLIC PANEL
Anion gap: 8 (ref 5–15)
BUN: 15 mg/dL (ref 8–23)
CO2: 27 mmol/L (ref 22–32)
Calcium: 9.7 mg/dL (ref 8.9–10.3)
Chloride: 100 mmol/L (ref 98–111)
Creatinine, Ser: 0.93 mg/dL (ref 0.61–1.24)
GFR, Estimated: >60 mL/min (ref >60)
Glucose, Bld: 93 mg/dL (ref 70–99)
Potassium: 4.6 mmol/L (ref 3.5–5.1)
Sodium: 135 mmol/L (ref 135–145)

## 2021-10-12 MED ORDER — NIRMATRELVIR/RITONAVIR (PAXLOVID)TABLET: 3.0000 | ORAL_TABLET | Freq: Two times a day (BID) | ORAL | 0 refills | Status: AC

## 2021-10-12 NOTE — ED Notes (Signed)
EMT-P provided AVS using Teachback Method. Patient verbalizes understanding of Discharge Instructions. Opportunity for Questioning and Answers were provided by EMT-P. Patient Discharged from ED.  ? ?

## 2021-10-12 NOTE — ED Provider Notes (Signed)
?[facility] ?Provider Note ? ? ?CSN: [id number] ?Arrival date & time: 10/12/21  1332 ? ?  ? ?History ? ?Chief Complaint  ?Patient presents with  ? Needs a prescrption  ? ? ?Frederick Watkins is a 76 y.o. male. ? ?Patient is a 76 year old male presenting for COVID-19 exposure.  Patient states his wife was diagnosed with COVID-19 virus today.  States he had some generalized feelings of unwellness 3 days ago and is currently asymptomatic.  Concerned that symptoms will return and worsen. ? ?The history is provided by the patient. No language interpreter was used.  ? ?  ? ?Home Medications ?Prior to Admission medications   ?Medication Sig Start Date End Date Taking? Authorizing Provider  ?Ascorbic Acid (VITAMIN C) 1000 MG tablet Take 1,000 mg by mouth 2 (two) times a day.    [provider]  ?aspirin EC 81 MG tablet Take 81 mg by mouth daily.    [provider]  ?atorvastatin (LIPITOR) 40 MG tablet Take 40 mg by mouth daily.    [provider]  ?gabapentin (NEURONTIN) 100 MG capsule Take 2 capsules every night 10/08/20   Cameron Sprang, MD  ?lisinopril (PRINIVIL,ZESTRIL) 20 MG tablet Take 20 mg by mouth every evening.    [provider]  ?LORazepam (ATIVAN) 1 MG tablet Take 1 mg by mouth daily as needed. 03/22/20   [provider]  ?pantoprazole (PROTONIX) 40 MG tablet Take 40 mg by mouth 2 (two) times daily. 12/09/19   [provider]  ?sertraline (ZOLOFT) 100 MG tablet Take 100 mg by mouth daily.    [provider]  ?   ? ?Allergies    ?Crestor [rosuvastatin]   ? ?Review of Systems   ?Review of Systems  ?Constitutional:  Negative for chills and fever.  ?HENT:  Negative for ear pain and sore throat.   ?Eyes:  Negative for pain and visual disturbance.  ?Respiratory:  Negative for cough and shortness of breath.   ?Cardiovascular:  Negative for chest pain and palpitations.  ?Gastrointestinal:  Negative for abdominal pain and vomiting.   ?Genitourinary:  Negative for dysuria and hematuria.  ?Musculoskeletal:  Negative for arthralgias and back pain.  ?Skin:  Negative for color change and rash.  ?Neurological:  Negative for seizures and syncope.  ?All other systems reviewed and are negative. ? ?Physical Exam ?Updated Vital Signs ?BP (!) 150/99 (BP Location: Left Arm)   Pulse 90   Temp 98.6 ?F (37 ?C) (Oral)   Resp 15   SpO2 99%  ?Physical Exam ?Vitals and nursing note reviewed.  ?Constitutional:   ?   General: He is not in acute distress. ?   Appearance: He is well-developed.  ?HENT:  ?   Head: Normocephalic and atraumatic.  ?Eyes:  ?   Conjunctiva/sclera: Conjunctivae normal.  ?Cardiovascular:  ?   Rate and Rhythm: Normal rate and regular rhythm.  ?   Heart sounds: No murmur heard. ?Pulmonary:  ?   Effort: Pulmonary effort is normal. No respiratory distress.  ?   Breath sounds: Normal breath sounds.  ?Abdominal:  ?   Palpations: Abdomen is soft.  ?   Tenderness: There is no abdominal tenderness.  ?Musculoskeletal:     ?   General: No swelling.  ?   Cervical back: Neck supple.  ?Skin: ?   General: Skin is warm and dry.  ?   Capillary Refill: Capillary refill takes less than 2 seconds.  ?Neurological:  ?   Mental  Status: He is alert.  ?Psychiatric:     ?   Mood and Affect: Mood normal.  ? ? ?ED Results / Procedures / Treatments   ?Labs ?(all labs ordered are listed, but only abnormal results are displayed) ?Labs Reviewed  ?BASIC METABOLIC PANEL  ? ? ?EKG ?None ? ?Radiology ?No results found. ? ?Procedures ?Procedures  ? ? ?Medications Ordered in ED ?Medications - No data to display ? ?ED Course/ Medical Decision Making/ A&P ?  ?                        ?Medical Decision Making ?Amount and/or Complexity of Data Reviewed ?Labs: ordered. ? ? ?1:54 PM ?Patient is a 76 year old male presenting to ER after COVID-19 exposure with symptoms 3 days ago.  No current symptoms.  Physical exam demonstrates no neck stiffness or neck rigidity.  No hypoxia.  Stable  renal function. Paxlovid sent to pharmacy.  Recommendations for rest, increase oral hydration, Motrin and Tylenol for body aches and fever. ? ?Patient in no distress and overall condition improved here in the ED. Detailed discussions were had with the patient regarding current findings, and need for close f/u with PCP or on call doctor. The patient has been instructed to return immediately if the symptoms worsen in any way for re-evaluation. Patient verbalized understanding and is in agreement with current care plan. All questions answered prior to discharge. ? ? ? ? ? ? ? ?Final Clinical Impression(s) / ED Diagnoses ?Final diagnoses:  ?Exposure to COVID-19 virus  ? ? ?Rx / DC Orders ?ED Discharge Orders   ? ? None  ? ?  ? ? ?  ?Lianne Cure, DO ?15/[birth year] [phone] ? ?

## 2021-10-12 NOTE — ED Triage Notes (Signed)
He is here with his wife, who was just dx with COVID. He is requesting labwork to be prescribed Paxlovid. He is asymptomatic and in no distress. ?

## 2021-10-15 ENCOUNTER — Ambulatory Visit: Payer: Medicare Other | Admitting: Physician Assistant

## 2021-10-25 ENCOUNTER — Ambulatory Visit: Payer: Medicare Other | Admitting: Physician Assistant

## 2022-01-02 DIAGNOSIS — M1711 Unilateral primary osteoarthritis, right knee: Secondary | ICD-10-CM | POA: Diagnosis not present

## 2022-01-30 DIAGNOSIS — H35372 Puckering of macula, left eye: Secondary | ICD-10-CM | POA: Diagnosis not present

## 2022-01-30 DIAGNOSIS — Z9889 Other specified postprocedural states: Secondary | ICD-10-CM | POA: Diagnosis not present

## 2022-01-30 DIAGNOSIS — D3132 Benign neoplasm of left choroid: Secondary | ICD-10-CM | POA: Diagnosis not present

## 2022-01-30 DIAGNOSIS — H43813 Vitreous degeneration, bilateral: Secondary | ICD-10-CM | POA: Diagnosis not present

## 2022-01-30 DIAGNOSIS — H1045 Other chronic allergic conjunctivitis: Secondary | ICD-10-CM | POA: Diagnosis not present

## 2022-01-30 DIAGNOSIS — Z961 Presence of intraocular lens: Secondary | ICD-10-CM | POA: Diagnosis not present

## 2022-05-12 DIAGNOSIS — Z23 Encounter for immunization: Secondary | ICD-10-CM | POA: Diagnosis not present

## 2022-09-15 DIAGNOSIS — J40 Bronchitis, not specified as acute or chronic: Secondary | ICD-10-CM | POA: Diagnosis not present

## 2022-10-07 DIAGNOSIS — K219 Gastro-esophageal reflux disease without esophagitis: Secondary | ICD-10-CM | POA: Diagnosis not present

## 2022-10-07 DIAGNOSIS — Z0000 Encounter for general adult medical examination without abnormal findings: Secondary | ICD-10-CM | POA: Diagnosis not present

## 2022-10-07 DIAGNOSIS — G4700 Insomnia, unspecified: Secondary | ICD-10-CM | POA: Diagnosis not present

## 2022-10-07 DIAGNOSIS — G629 Polyneuropathy, unspecified: Secondary | ICD-10-CM | POA: Diagnosis not present

## 2022-10-07 DIAGNOSIS — E7800 Pure hypercholesterolemia, unspecified: Secondary | ICD-10-CM | POA: Diagnosis not present

## 2022-10-07 DIAGNOSIS — I10 Essential (primary) hypertension: Secondary | ICD-10-CM | POA: Diagnosis not present

## 2022-10-07 DIAGNOSIS — G3184 Mild cognitive impairment, so stated: Secondary | ICD-10-CM | POA: Diagnosis not present

## 2022-10-07 DIAGNOSIS — Z23 Encounter for immunization: Secondary | ICD-10-CM | POA: Diagnosis not present

## 2022-10-09 NOTE — Progress Notes (Signed)
Cardiology Office Note:    Date:  10/10/2022   ID:  DESMUND KUPER, DOB [birth year], MRN [id number]  PCP:  Johny Blamer, MD   [facility] Providers Cardiologist:  Donato Schultz, MD     Referring MD: Johny Blamer, MD  History of Present Illness:    Frederick Watkins is a 77 y.o. male here fo CAD follow up. RBBB HTN HL.   Former patient of Dr. Donnie Aho.  Overall he is doing quite well without any fevers chills nausea vomiting syncope bleeding.  Has an occasional twinge of chest discomfort likely musculoskeletal.  No significant shortness of breath.  In the past has had problems with Crestor causing tongue swelling   Former Surveyor, minerals at [employer].  GrandDaughter, [employer], [place] [employer].  Now teaches music.  Just married.    Coronary artery calcification with negative nuclear stress test many years ago.   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  Former patient of Dr. Donnie Aho.   Former Surveyor, minerals.  Daughter, [employer]. Drums.     Coronary artery calcification with negative nuclear stress test many years ago.   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  Past Medical History:  Diagnosis Date   Coronary artery calcification 12/06/2018   Coronary artery disease involving native coronary artery of native heart without angina pectoris 12/06/2018   Essential hypertension 12/06/2018   GERD (gastroesophageal reflux disease)    Lacunar infarct    Left basal ganglia   Mixed hyperlipidemia 12/06/2018   Mucous cyst of finger    Left thumb   Right bundle branch block 12/06/2018   RLS (restless legs syndrome)     Past Surgical History:  Procedure Laterality Date   EAR CYST EXCISION  05/31/2012   Procedure: CYST REMOVAL;  Surgeon: Jodi Marble, MD;  Location[facility];  Service: Orthopedics;  Laterality: Left;  Left Thumb Mucous Cyst Excision   RETINAL DETACHMENT REPAIR W/ SCLERAL BUCKLE LE  SEPT 2013   W/ CATARACT EXTRACTION WITH LENS  IMPLANT , RIGHT EYE    Current Medications: Current Meds  Medication Sig   Ascorbic Acid (VITAMIN C) 1000 MG tablet Take 1,000 mg by mouth 2 (two) times a day.   aspirin EC 81 MG tablet Take 81 mg by mouth daily.   atorvastatin (LIPITOR) 40 MG tablet Take 40 mg by mouth daily.   ezetimibe (ZETIA) 10 MG tablet Take 1 tablet (10 mg total) by mouth daily.   gabapentin (NEURONTIN) 100 MG capsule Take 2 capsules every night   lisinopril (PRINIVIL,ZESTRIL) 20 MG tablet Take 20 mg by mouth every evening.   LORazepam (ATIVAN) 1 MG tablet Take 1 mg by mouth daily as needed.   pantoprazole (PROTONIX) 40 MG tablet Take 40 mg by mouth 2 (two) times daily.   sertraline (ZOLOFT) 100 MG tablet Take 100 mg by mouth daily.     Allergies:   Crestor [rosuvastatin]   Social History   Socioeconomic History   Marital status: Married    Spouse name: Not on file   Number of children: Not on file   Years of education: 15   Highest education level: Some college, no degree  Occupational History   Occupation: Retired  Tobacco Use   Smoking status: Never   Smokeless tobacco: Never  Vaping Use   Vaping Use: Never used  Substance and Sexual Activity   Alcohol use: Yes    Alcohol/week: 7.0 standard drinks of alcohol    Types: 7 Glasses of  wine per week   Drug use: No   Sexual activity: Not on file  Other Topics Concern   Not on file  Social History Narrative   Right handed    Lives with wife    Social Determinants of Health   Financial Resource Strain: Not on file  Food Insecurity: Not on file  Transportation Needs: Not on file  Physical Activity: Not on file  Stress: Not on file  Social Connections: Not on file     Family History: The patient's family history includes Dementia in his mother.  ROS:   Please see the history of present illness.     All other systems reviewed and are negative.  EKGs/Labs/Other Studies Reviewed:    EKG: 10/10/2022 normal sinus rhythm right bundle branch  block  The ekg ordered today demonstrates SR 68 RBBB  Recent Labs: 10/12/2021: BUN 15; Creatinine, Ser 0.93; Potassium 4.6; Sodium 135  Recent Lipid Panel No results found for: "CHOL", "TRIG", "HDL", "CHOLHDL", "VLDL", "LDLCALC", "LDLDIRECT"  LDL 88 triglycerides 106 hemoglobin 15.7 creatinine 0.9 potassium 4.9 ALT 29 TSH 1.9 from outside labs  Risk Assessment/Calculations:          Physical Exam:    VS:  BP 136/86 (BP Location: Left Arm, Patient Position: Sitting, Cuff Size: Normal)   Pulse 77   Ht 6' (1.829 m)   Wt 209 lb (94.8 kg)   BMI 28.35 kg/m     Wt Readings from Last 3 Encounters:  10/10/22 209 lb (94.8 kg)  04/15/21 209 lb (94.8 kg)  04/11/21 210 lb (95.3 kg)     GEN:  Well nourished, well developed in no acute distress HEENT: Normal NECK: No JVD; No carotid bruits LYMPHATICS: No lymphadenopathy CARDIAC: RRR, no murmurs, rubs, gallops RESPIRATORY:  Clear to auscultation without rales, wheezing or rhonchi  ABDOMEN: Soft, non-tender, non-distended MUSCULOSKELETAL:  No edema; No deformity  SKIN: Warm and dry NEUROLOGIC:  Alert and oriented x 3 PSYCHIATRIC:  Normal affect   ASSESSMENT:    1. Mixed hyperlipidemia   2. Medication management   3. Coronary artery disease involving native coronary artery of native heart without angina pectoris     PLAN:    In order of problems listed above: Coronary artery disease involving native coronary artery of native heart without angina pectoris Calcified plaque from calcium score.  Negative nuclear stress test 2017.  Continue with aggressive secondary risk factor prevention, low-dose aspirin, atorvastatin.  I am going to add Zetia to help decrease his cholesterol less than 70.  Right bundle branch block No high risk symptoms such as syncope.  Stable on EKG.  Heart rate 77.  Essential hypertension Overall well controlled on average.  Sometimes elevated in the doctor's office.  Good overall control.  Mixed  hyperlipidemia Currently good control.  LDL 89 goal less than 70.  We will go ahead and add Zetia 10 mg.  Continue with atorvastatin 40 mg.  No myalgias..  Mild cognitive impairment Notes from outside MD reviewed.   Overall stable.  Enjoys the drums.   Keep drumming.  Medication Adjustments/Labs and Tests Ordered: Current medicines are reviewed at length with the patient today.  Concerns regarding medicines are outlined above.  Orders Placed This Encounter  Procedures   Lipid panel   EKG 12-Lead    Meds ordered this encounter  Medications   ezetimibe (ZETIA) 10 MG tablet    Sig: Take 1 tablet (10 mg total) by mouth daily.    Dispense:  90  tablet    Refill:  3     Patient Instructions  Medication Instructions:  Please start zetia 10 mg a day. Continue all other medications as listed.  *If you need a refill on your cardiac medications before your next appointment, please call your pharmacy*   Lab Work: Please have blood work in 2 months (Lipid)  If you have labs (blood work) drawn today and your tests are completely normal, you will receive your results only by: MyChart Message (if you have MyChart) OR A paper copy in the mail If you have any lab test that is abnormal or we need to change your treatment, we will call you to review the results.   Follow-Up: At [facility], you and your health needs are our priority.  As part of our continuing mission to provide you with exceptional heart care, we have created designated Provider Care Teams.  These Care Teams include your primary Cardiologist (physician) and Advanced Practice Providers (APPs -  Physician Assistants and Nurse Practitioners) who all work together to provide you with the care you need, when you need it.  We recommend signing up for the patient portal called "MyChart".  Sign up information is provided on this After Visit Summary.  MyChart is used to connect with patients for Virtual Visits  (Telemedicine).  Patients are able to view lab/test results, encounter notes, upcoming appointments, etc.  Non-urgent messages can be sent to your provider as well.   To learn more about what you can do with MyChart, go to [email or web address].    Your next appointment:   1 year(s)  Provider:   Donato Schultz, MD        Signed, Donato Schultz, MD  10/10/2022 10:07 AM    [facility]

## 2022-10-10 ENCOUNTER — Encounter: Payer: Self-pay | Admitting: Cardiology

## 2022-10-10 ENCOUNTER — Ambulatory Visit: Payer: Medicare Other | Attending: Cardiology | Admitting: Cardiology

## 2022-10-10 VITALS — BP 136/86 | HR 77 | Ht 72.0 in | Wt 209.0 lb

## 2022-10-10 DIAGNOSIS — E782 Mixed hyperlipidemia: Secondary | ICD-10-CM | POA: Diagnosis not present

## 2022-10-10 DIAGNOSIS — Z79899 Other long term (current) drug therapy: Secondary | ICD-10-CM

## 2022-10-10 DIAGNOSIS — I2510 Atherosclerotic heart disease of native coronary artery without angina pectoris: Secondary | ICD-10-CM | POA: Diagnosis not present

## 2022-10-10 MED ORDER — EZETIMIBE 10 MG PO TABS: 10.0000 mg | ORAL_TABLET | Freq: Every day | ORAL | 3 refills | Status: DC

## 2022-10-10 NOTE — Patient Instructions (Signed)
Medication Instructions:  Please start zetia 10 mg a day. Continue all other medications as listed.  *If you need a refill on your cardiac medications before your next appointment, please call your pharmacy*   Lab Work: Please have blood work in 2 months (Lipid)  If you have labs (blood work) drawn today and your tests are completely normal, you will receive your results only by: MyChart Message (if you have MyChart) OR A paper copy in the mail If you have any lab test that is abnormal or we need to change your treatment, we will call you to review the results.   Follow-Up: At [facility], you and your health needs are our priority.  As part of our continuing mission to provide you with exceptional heart care, we have created designated Provider Care Teams.  These Care Teams include your primary Cardiologist (physician) and Advanced Practice Providers (APPs -  Physician Assistants and Nurse Practitioners) who all work together to provide you with the care you need, when you need it.  We recommend signing up for the patient portal called "MyChart".  Sign up information is provided on this After Visit Summary.  MyChart is used to connect with patients for Virtual Visits (Telemedicine).  Patients are able to view lab/test results, encounter notes, upcoming appointments, etc.  Non-urgent messages can be sent to your provider as well.   To learn more about what you can do with MyChart, go to [email or web address].    Your next appointment:   1 year(s)  Provider:   Donato Schultz, MD

## 2022-12-06 ENCOUNTER — Emergency Department (HOSPITAL_BASED_OUTPATIENT_CLINIC_OR_DEPARTMENT_OTHER): Payer: Medicare Other

## 2022-12-06 ENCOUNTER — Emergency Department (HOSPITAL_COMMUNITY): Payer: Medicare Other

## 2022-12-06 ENCOUNTER — Emergency Department (HOSPITAL_BASED_OUTPATIENT_CLINIC_OR_DEPARTMENT_OTHER)
Admission: EM | Admit: 2022-12-06 | Discharge: 2022-12-06 | Disposition: A | Discharge status: Home / Self Care | Payer: Medicare Other | Attending: Emergency Medicine | Admitting: Emergency Medicine

## 2022-12-06 ENCOUNTER — Other Ambulatory Visit: Payer: Self-pay

## 2022-12-06 DIAGNOSIS — Z79899 Other long term (current) drug therapy: Secondary | ICD-10-CM | POA: Diagnosis not present

## 2022-12-06 DIAGNOSIS — R42 Dizziness and giddiness: Secondary | ICD-10-CM

## 2022-12-06 DIAGNOSIS — Z7982 Long term (current) use of aspirin: Secondary | ICD-10-CM | POA: Insufficient documentation

## 2022-12-06 DIAGNOSIS — I10 Essential (primary) hypertension: Secondary | ICD-10-CM | POA: Insufficient documentation

## 2022-12-06 DIAGNOSIS — I6523 Occlusion and stenosis of bilateral carotid arteries: Secondary | ICD-10-CM | POA: Diagnosis not present

## 2022-12-06 LAB — CBC
HCT: 47.1 % (ref 39.0–52.0)
Hemoglobin: 16.1 g/dL (ref 13.0–17.0)
MCH: 31.2 pg (ref 26.0–34.0)
MCHC: 34.2 g/dL (ref 30.0–36.0)
MCV: 91.3 fL (ref 80.0–100.0)
Platelets: 183 10*3/uL (ref 150–400)
RBC: 5.16 MIL/uL (ref 4.22–5.81)
RDW: 13.4 % (ref 11.5–15.5)
WBC: 8.5 10*3/uL (ref 4.0–10.5)
nRBC: 0 % (ref 0.0–0.2)

## 2022-12-06 LAB — BASIC METABOLIC PANEL
Anion gap: 8 (ref 5–15)
BUN: 15 mg/dL (ref 8–23)
CO2: 26 mmol/L (ref 22–32)
Calcium: 9.2 mg/dL (ref 8.9–10.3)
Chloride: 100 mmol/L (ref 98–111)
Creatinine, Ser: 0.96 mg/dL (ref 0.61–1.24)
GFR, Estimated: >60 mL/min (ref >60)
Glucose, Bld: 131 mg/dL — ABNORMAL HIGH (ref 70–99)
Potassium: 4.3 mmol/L (ref 3.5–5.1)
Sodium: 134 mmol/L — ABNORMAL LOW (ref 135–145)

## 2022-12-06 LAB — CBG MONITORING, ED: Glucose-Capillary: 128 mg/dL — ABNORMAL HIGH (ref 70–99)

## 2022-12-06 MED ORDER — ONDANSETRON HCL 4 MG/2ML IJ SOLN
4.0000 mg | Freq: Once | INTRAMUSCULAR | Status: AC
  Administered 2022-12-06: 4 mg via INTRAVENOUS
  Filled 2022-12-06: qty 2

## 2022-12-06 MED ORDER — MECLIZINE HCL 25 MG PO TABS: 25.0000 mg | ORAL_TABLET | Freq: Three times a day (TID) | ORAL | 0 refills | Status: DC | PRN

## 2022-12-06 MED ORDER — LORAZEPAM 1 MG PO TABS
0.5000 mg | ORAL_TABLET | ORAL | Status: AC | PRN
  Administered 2022-12-06: 0.5 mg via ORAL
  Filled 2022-12-06: qty 1

## 2022-12-06 MED ORDER — IOHEXOL 350 MG/ML SOLN
100.0000 mL | Freq: Once | INTRAVENOUS | Status: AC | PRN
  Administered 2022-12-06: 75 mL via INTRAVENOUS

## 2022-12-06 MED ORDER — GADOBUTROL 1 MMOL/ML IV SOLN
9.3000 mL | Freq: Once | INTRAVENOUS | Status: AC | PRN
  Administered 2022-12-06: 9.3 mL via INTRAVENOUS

## 2022-12-06 MED ORDER — MECLIZINE HCL 25 MG PO TABS
25.0000 mg | ORAL_TABLET | Freq: Once | ORAL | Status: AC
  Administered 2022-12-06: 25 mg via ORAL
  Filled 2022-12-06: qty 1

## 2022-12-06 NOTE — ED Triage Notes (Signed)
Pt transferred from [facility] P[facility]. Here for MRI

## 2022-12-06 NOTE — ED Provider Notes (Signed)
This patient arrived as a transfer from the med center pending MRI imaging of the brain for acute onset of vertigo.  MR imaging of the brain was completed and personally reviewed, and I discussed the MRI as well with the on-call neurologist Dr Otelia Limes.  There is no acute CVA or stroke.  The patient is noted to have some very nonspecific T2 hyperintensity signals that I discussed with the neurologist, felt to be incidental and normal for age. I have a much lower suspicion for acute demyelinating lesion or other emergent neurological process.  The patient symptoms are significantly improved since his treatment earlier today.  He feels back to normal.  He is able to ambulate and is no longer vertiginous.  He does report to me that he has had similar episodes in the past, several times, and had "head maneuvers" which helped with the symptoms.  Therefore I strongly suspect this is related to peripheral vertigo.  He can follow-up with ENT and I will prescribe him Antivert.  His wife will take him home.  They are comfortable with this plan.  Per my phone discussion with the neurologist, the patient is not needing neurology follow-up at this time.   Terald Sleeper, MD 12/06/22 [phone]

## 2022-12-06 NOTE — Plan of Care (Signed)
Assessment:  Patient presenting with somewhat nonspecific dizziness.  Certainly he does have risk factors for stroke, but given significant positional nature of symptoms could also consider CSF leak (although patient is not complaining of headache).  Recommend:  MRI brain with and without contrast If symptoms are not improving with supportive care and MRI brain with and without is unrevealing, consider neurology evaluation and/or vestibular PT evaluation per discretion of ED physician  These are curbside recommendations based upon the information readily available in the chart on brief review as well as history and examination information provided to me by requesting provider and do not replace a full detailed consult, imaging recommendations requested by Frederick Watkins for this patient  In brief this is a 77 year old gentleman with past medical history significant for hyperlipidemia, hypertension, remote left basal ganglia infarct without any significant residual symptoms, coronary artery disease, right bundle branch block, anxiety/depression  Reportedly he has been intermittently dizzy for several days, but this morning woke up with severe dizziness that is present constantly but exacerbated on movement for which he came to the ED for evaluation.  Current vital signs: BP (!) 142/87   Pulse 69   Temp 97.6 F (36.4 C) (Oral)   Resp 15   Ht 6' (1.829 m)   Wt 93 kg   SpO2 97%   BMI 27.80 kg/m  Vital signs in last 24 hours: Temp:  [97.6 F (36.4 C)] 97.6 F (36.4 C) (06/01 0947) Pulse Rate:  [66-77] 69 (06/01 1230) Resp:  [10-20] 15 (06/01 1230) BP: (138-157)/(82-93) 142/87 (06/01 1230) SpO2:  [96 %-100 %] 97 % (06/01 1230) Weight:  [93 kg] 93 kg (06/01 0943)  Laboratory evaluation has been unremarkable except for mild hyperglycemia (131), mild hyponatremia (134)  CTA head and neck personally reviewed, agree with radiology:   1. Negative for large vessel occlusion. 2. Mild for age  extracranial atherosclerosis, but up to moderate bilateral ICA siphon calcified plaque, Moderate supraclinoid left ICA stenosis. 3. Mild other intracranial atherosclerosis, left MCA M1 segment and right MCA M2 and distal branches. 4. No acute intracranial abnormality. Negative for age CT appearance of the brain.  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

## 2022-12-06 NOTE — ED Notes (Signed)
Pt denies dizziness right now. MRI called to let them know pt has arrived.

## 2022-12-06 NOTE — ED Notes (Signed)
Report given to the charge RN at [facility]... 

## 2022-12-06 NOTE — ED Triage Notes (Signed)
Patient arrives with complaints of worsening dizziness for a few days. Patient states that the dizziness has been intermittent throughout the week, but he has been constantly dizzy this morning.

## 2022-12-06 NOTE — ED Provider Notes (Addendum)
[place] [facility]ERGENC[facility] DEPARTMENT AT [facility] Provider Note   CSN: [id number] Arrival date & time: 12/06/22  4098     History  Chief Complaint  Patient presents with   Dizziness    KIP CALLWOOD is a 77 y.o. male.   Dizziness    77 year old male with medical history significant for GERD, restless leg syndrome, HTN, HLD, left basal ganglia infarct who presents to the emergency department with acute onset dizziness.  The patient states that he has had intermittent dizziness throughout the week.  He went to bed last night without significant dizziness and woke up this morning with room spinning dizziness that has persisted and not stopped throughout today.  No chest pain, shortness of breath, visual loss.  No facial droop, numbness or weakness.  Home Medications Prior to Admission medications   Medication Sig Start Date End Date Taking? Authorizing Provider  Ascorbic Acid (VITAMIN C) 1000 MG tablet Take 1,000 mg by mouth 2 (two) times a day.    [provider]  aspirin EC 81 MG tablet Take 81 mg by mouth daily.    [provider]  atorvastatin (LIPITOR) 40 MG tablet Take 40 mg by mouth daily.    [provider]  ezetimibe (ZETIA) 10 MG tablet Take 1 tablet (10 mg total) by mouth daily. 10/10/22   Jake Bathe, MD  gabapentin (NEURONTIN) 100 MG capsule Take 2 capsules every night 10/08/20   Van Clines, MD  lisinopril (PRINIVIL,ZESTRIL) 20 MG tablet Take 20 mg by mouth every evening.    [provider]  LORazepam (ATIVAN) 1 MG tablet Take 1 mg by mouth daily as needed. 03/22/20   [provider]  pantoprazole (PROTONIX) 40 MG tablet Take 40 mg by mouth 2 (two) times daily. 12/09/19   [provider]  sertraline (ZOLOFT) 100 MG tablet Take 100 mg by mouth daily.    [provider]      Allergies    Crestor [rosuvastatin]    Review of Systems   Review of Systems  Neurological:  Positive for dizziness.     Physical Exam Updated Vital Signs BP (!) 142/87   Pulse 69   Temp 97.6 F (36.4 C) (Oral)   Resp 15   Ht 6' (1.829 m)   Wt 93 kg   SpO2 97%   BMI 27.80 kg/m  Physical Exam Vitals and nursing note reviewed.  Constitutional:      General: He is not in acute distress.    Appearance: He is well-developed.  HENT:     Head: Normocephalic and atraumatic.  Eyes:     Conjunctiva/sclera: Conjunctivae normal.     Pupils: Pupils are equal, round, and reactive to light.  Cardiovascular:     Rate and Rhythm: Normal rate and regular rhythm.     Heart sounds: No murmur heard. Pulmonary:     Effort: Pulmonary effort is normal. No respiratory distress.     Breath sounds: Normal breath sounds.  Abdominal:     General: There is no distension.     Palpations: Abdomen is soft.     Tenderness: There is no abdominal tenderness. There is no guarding.  Musculoskeletal:        General: No swelling, deformity or signs of injury.     Cervical back: Neck supple.  Skin:    General: Skin is warm and dry.     Capillary Refill: Capillary refill takes less than 2 seconds.     Findings:  No lesion or rash.  Neurological:     General: No focal deficit present.     Mental Status: He is alert. Mental status is at baseline.     Comments: MENTAL STATUS EXAM:    Orientation: Alert and oriented to person, place and time.  Memory: Cooperative, follows commands well.  Language: Speech is clear and language is normal.   CRANIAL NERVES:    CN 2 (Optic): Visual fields intact to confrontation.  CN 3,4,6 (EOM): Pupils equal and reactive to light. Full extraocular eye movement without nystagmus.  CN 5 (Trigeminal): Facial sensation is normal, no weakness of masticatory muscles.  CN 7 (Facial): No facial weakness or asymmetry.  CN 8 (Auditory): Auditory acuity grossly normal.  CN 9,10 (Glossophar): The uvula is midline, the palate elevates symmetrically.  CN 11 (spinal access): Normal sternocleidomastoid and  trapezius strength.  CN 12 (Hypoglossal): The tongue is midline. No atrophy or fasciculations.Marland Kitchen   MOTOR:  Muscle Strength: 5/5RUE, 5/5LUE, 5/5RLE, 5/5LLE.   COORDINATION:   Intact finger-to-nose, no tremor.   SENSATION:   Intact to light touch all four extremities.  GAIT: Could not assess  HINTS: No nystagmus vertically or horizontally No skew No corrective saccade on doll's eyes   Psychiatric:        Mood and Affect: Mood normal.     ED Results / Procedures / Treatments   Labs (all labs ordered are listed, but only abnormal results are displayed) Labs Reviewed  BASIC METABOLIC PANEL - Abnormal; Notable for the following components:      Result Value   Sodium 134 (*)    Glucose, Bld 131 (*)    All other components within normal limits  CBG MONITORING, ED - Abnormal; Notable for the following components:   Glucose-Capillary 128 (*)    All other components within normal limits  CBC    EKG EKG Interpretation  Date/Time:  Saturday December 06 2022 09:42:19 EDT Ventricular Rate:  69 PR Interval:  173 QRS Duration: 161 QT Interval:  446 QTC Calculation: 478 R Axis:   50 Text Interpretation: Sinus rhythm Right bundle branch block Confirmed by Ernie Avena ([id number]) on 12/06/2022 10:05:19 AM  Radiology CT ANGIO HEAD NECK W WO CM  Result Date: 12/06/2022 CLINICAL DATA:  77 year old male with increasing dizziness. EXAM: CT ANGIOGRAPHY HEAD AND NECK WITH AND WITHOUT CONTRAST TECHNIQUE: Multidetector CT imaging of the head and neck was performed using the standard protocol during bolus administration of intravenous contrast. Multiplanar CT image reconstructions and MIPs were obtained to evaluate the vascular anatomy. Carotid stenosis measurements (when applicable) are obtained utilizing NASCET criteria, using the distal internal carotid diameter as the denominator. RADIATION DOSE REDUCTION: This exam was performed according to the departmental dose-optimization program which includes  automated exposure control, adjustment of the mA and/or kV according to patient size and/or use of iterative reconstruction technique. CONTRAST:  75mL OMNIPAQUE IOHEXOL 350 MG/ML SOLN COMPARISON:  Brain MRI 04/17/2020.  Head CT 09/04/2013. FINDINGS: CT HEAD Brain: Cerebral volume remains normal for age. No midline shift, ventriculomegaly, mass effect, evidence of mass lesion, intracranial hemorrhage or evidence of cortically based acute infarction. Gray-white differentiation has not significantly changed, within normal limits for age. Calvarium and skull base: No acute osseous abnormality identified. Paranasal sinuses: Visualized paranasal sinuses and mastoids are stable and well aerated. Orbits: No acute orbit or scalp soft tissue findings. CTA NECK Skeleton: No acute osseous abnormality identified. Fairly age-appropriate cervical spine degeneration. Upper chest: Mild dependent atelectasis suspected. Otherwise negative.  Other neck: Negative. Aortic arch: 3 vessel arch.  Minimal arch atherosclerosis. Right carotid system: Mild brachiocephalic origin calcified plaque without stenosis. Negative right CCA. Negative right carotid bifurcation. Minimal atherosclerosis of the distal right ICA below the skull base. No stenosis. Left carotid system: Negative left CCA. Mild soft plaque at the right ICA origin. No significant stenosis to the skull base. Vertebral arteries: Proximal right subclavian artery and right vertebral artery origin are normal. Codominant right vertebral artery is patent and normal to the skull base. Proximal left subclavian artery and left vertebral artery origin are normal. Fairly codominant left vertebral artery is patent to the skull base with no significant plaque or stenosis. CTA HEAD Posterior circulation: Mildly dominant left V4 segment. Mild left V4 calcified plaque. No distal vertebral or vertebrobasilar junction stenosis. Both PICA origins are patent. Patent basilar artery without stenosis.  Bilateral SCA and PCA origins are patent. Posterior communicating arteries are diminutive or absent. Bilateral PCA branches are within normal limits. Anterior circulation: Both ICA siphons are patent. Mild cavernous, moderate supraclinoid left siphon calcified plaque. Subsequent moderate proximal supraclinoid stenosis on series 11, image 148. Contralateral similar right ICA mild to moderate cavernous and supraclinoid calcified plaque. Only mild right siphon stenosis. Patent carotid termini. Patent MCA and ACA origins. Normal anterior communicating artery. Bilateral ACA branches are within normal limits. Left MCA M1 segment is tortuous with a kinked appearance (series 12, image 30) but patent to the bifurcation without hemodynamically significant stenosis. Contralateral right MCA M1 segment is also tortuous and patent to the bifurcation without stenosis. Bilateral MCA branches are patent. There is mild posterior right M2 and M3 branch irregularity. Venous sinuses: Early contrast timing, grossly patent. Anatomic variants: Mildly dominant left vertebral artery. Review of the MIP images confirms the above findings IMPRESSION: 1. Negative for large vessel occlusion. 2. Mild for age extracranial atherosclerosis, but up to moderate bilateral ICA siphon calcified plaque, Moderate supraclinoid left ICA stenosis. 3. Mild other intracranial atherosclerosis, left MCA M1 segment and right MCA M2 and distal branches. 4. No acute intracranial abnormality. Negative for age CT appearance of the brain. Electronically Signed   By: Odessa Fleming M.D.   On: 12/06/2022 11:29    Procedures Procedures    Medications Ordered in ED Medications  LORazepam (ATIVAN) tablet 0.5 mg (has no administration in time range)  meclizine (ANTIVERT) tablet 25 mg (25 mg Oral Given 12/06/22 1028)  iohexol (OMNIPAQUE) 350 MG/ML injection 100 mL (75 mLs Intravenous Contrast Given 12/06/22 1055)  ondansetron (ZOFRAN) injection 4 mg (4 mg Intravenous Given  12/06/22 1156)    ED Course/ Medical Decision Making/ A&P                             Medical Decision Making Amount and/or Complexity of Data Reviewed Labs: ordered. Radiology: ordered.  Risk Prescription drug management.      77 year old male with medical history significant for GERD, restless leg syndrome, HTN, HLD, left basal ganglia infarct who presents to the emergency department with acute onset dizziness.  The patient states that he has had intermittent dizziness throughout the week.  He went to bed last night without significant dizziness and woke up this morning with room spinning dizziness that has persisted and not stopped throughout today.  No chest pain, shortness of breath, visual loss.  No facial droop, numbness or weakness.  On arrival, the patient was afebrile, vitally stable, mildly hypertensive BP 148/83, saturating 97% on  room air.  Physical exam significant for a normal neurologic exam, unable to assess gait as the patient did start vomiting and with symptomatic vertigo with room spinning dizziness on attempts at standing.  Orthostatics could not be completed due to this as well.  Negative dysmetria, hints exam with no significant nystagmus vertically or horizontally, negative test of skew, no corrective saccade on doll's eyes maneuver.  Concern for central vertigo, peripheral vertigo, vestibular neuritis, vascular abnormality.  No headache to suggest CSF leak or meningitis or encephalitis, no neck rigidity, no focal neurologic deficit.  Hints exam could not rule out central etiology.  CTA imaging was ordered and results are as follows IMPRESSION:  1. Negative for large vessel occlusion.  2. Mild for age extracranial atherosclerosis, but up to moderate  bilateral ICA siphon calcified plaque, Moderate supraclinoid left  ICA stenosis.  3. Mild other intracranial atherosclerosis, left MCA M1 segment and  right MCA M2 and distal branches.  4. No acute intracranial  abnormality. Negative for age CT appearance  of the brain.   Laboratory evaluation significant for suffused CBG 128, BMP unremarkable, CBC unremarkable.  She was administered meclizine orally in addition to IV Zofran.  He had 1 episode of NBNB emesis in the ED.   Spoke with on-call neurology, Dr. Iver Nestle, who recommended ED to ED transfer for MRI brain with and without contrast.  Dr. Anitra Lauth subsequently accepted the patient in the ED to ED transfer and the patient was transferred by POV at their request after discussion with the patient and his wife bedside.    Final Clinical Impression(s) / ED Diagnoses Final diagnoses:  Vertigo    Rx / DC Orders ED Discharge Orders     None         Ernie Avena, MD 12/06/22 1343    Ernie Avena, MD 12/06/22 1346

## 2022-12-10 ENCOUNTER — Ambulatory Visit: Payer: Medicare Other | Attending: Cardiology

## 2022-12-10 DIAGNOSIS — E782 Mixed hyperlipidemia: Secondary | ICD-10-CM

## 2022-12-10 DIAGNOSIS — I2510 Atherosclerotic heart disease of native coronary artery without angina pectoris: Secondary | ICD-10-CM

## 2022-12-10 DIAGNOSIS — Z79899 Other long term (current) drug therapy: Secondary | ICD-10-CM | POA: Diagnosis not present

## 2022-12-10 LAB — LIPID PANEL
Chol/HDL Ratio: 2.4 ratio (ref 0.0–5.0)
Cholesterol, Total: 136 mg/dL (ref 100–199)
HDL: 57 mg/dL (ref >39)
LDL Chol Calc (NIH): 62 mg/dL (ref 0–99)
Triglycerides: 90 mg/dL (ref 0–149)
VLDL Cholesterol Cal: 17 mg/dL (ref 5–40)

## 2022-12-29 ENCOUNTER — Other Ambulatory Visit: Payer: Self-pay

## 2022-12-29 MED ORDER — EZETIMIBE 10 MG PO TABS: 10.0000 mg | ORAL_TABLET | Freq: Every day | ORAL | 3 refills | Status: DC

## 2023-03-17 DIAGNOSIS — M1711 Unilateral primary osteoarthritis, right knee: Secondary | ICD-10-CM | POA: Diagnosis not present

## 2023-04-20 DIAGNOSIS — L578 Other skin changes due to chronic exposure to nonionizing radiation: Secondary | ICD-10-CM | POA: Diagnosis not present

## 2023-04-20 DIAGNOSIS — L814 Other melanin hyperpigmentation: Secondary | ICD-10-CM | POA: Diagnosis not present

## 2023-04-20 DIAGNOSIS — L821 Other seborrheic keratosis: Secondary | ICD-10-CM | POA: Diagnosis not present

## 2023-05-04 ENCOUNTER — Emergency Department (HOSPITAL_COMMUNITY)
Admission: EM | Admit: 2023-05-04 | Discharge: 2023-05-04 | Disposition: A | Discharge status: Home / Self Care | Payer: Medicare Other | Attending: Emergency Medicine | Admitting: Emergency Medicine

## 2023-05-04 ENCOUNTER — Ambulatory Visit (HOSPITAL_BASED_OUTPATIENT_CLINIC_OR_DEPARTMENT_OTHER)
Admission: RE | Admit: 2023-05-04 | Discharge: 2023-05-04 | Disposition: A | Discharge status: Home / Self Care | Payer: Medicare Other | Source: Ambulatory Visit | Attending: Family Medicine | Admitting: Family Medicine

## 2023-05-04 ENCOUNTER — Other Ambulatory Visit (HOSPITAL_COMMUNITY): Payer: Self-pay | Admitting: Family Medicine

## 2023-05-04 ENCOUNTER — Encounter (HOSPITAL_COMMUNITY): Payer: Self-pay

## 2023-05-04 DIAGNOSIS — M79671 Pain in right foot: Secondary | ICD-10-CM | POA: Diagnosis not present

## 2023-05-04 DIAGNOSIS — S0990XA Unspecified injury of head, initial encounter: Secondary | ICD-10-CM | POA: Diagnosis present

## 2023-05-04 DIAGNOSIS — R4182 Altered mental status, unspecified: Secondary | ICD-10-CM | POA: Diagnosis not present

## 2023-05-04 DIAGNOSIS — W06XXXA Fall from bed, initial encounter: Secondary | ICD-10-CM | POA: Diagnosis not present

## 2023-05-04 DIAGNOSIS — R531 Weakness: Secondary | ICD-10-CM | POA: Insufficient documentation

## 2023-05-04 DIAGNOSIS — R29898 Other symptoms and signs involving the musculoskeletal system: Secondary | ICD-10-CM

## 2023-05-04 DIAGNOSIS — S065X0A Traumatic subdural hemorrhage without loss of consciousness, initial encounter: Secondary | ICD-10-CM | POA: Diagnosis not present

## 2023-05-04 DIAGNOSIS — Z7982 Long term (current) use of aspirin: Secondary | ICD-10-CM | POA: Diagnosis not present

## 2023-05-04 DIAGNOSIS — S065XAA Traumatic subdural hemorrhage with loss of consciousness status unknown, initial encounter: Secondary | ICD-10-CM

## 2023-05-04 DIAGNOSIS — M21371 Foot drop, right foot: Secondary | ICD-10-CM

## 2023-05-04 DIAGNOSIS — I6200 Nontraumatic subdural hemorrhage, unspecified: Secondary | ICD-10-CM | POA: Diagnosis not present

## 2023-05-04 HISTORY — DX: Personal history of other diseases of the digestive system: Z87.19

## 2023-05-04 HISTORY — DX: Acquired absence of other specified parts of digestive tract: Z90.49

## 2023-05-04 LAB — DIFFERENTIAL
Abs Immature Granulocytes: 0.02 10*3/uL (ref 0.00–0.07)
Basophils Absolute: 0 10*3/uL (ref 0.0–0.1)
Basophils Relative: 1 %
Eosinophils Absolute: 0.1 10*3/uL (ref 0.0–0.5)
Eosinophils Relative: 1 %
Immature Granulocytes: 0 %
Lymphocytes Relative: 27 %
Lymphs Abs: 2.3 10*3/uL (ref 0.7–4.0)
Monocytes Absolute: 0.9 10*3/uL (ref 0.1–1.0)
Monocytes Relative: 11 %
Neutro Abs: 5.2 10*3/uL (ref 1.7–7.7)
Neutrophils Relative %: 60 %

## 2023-05-04 LAB — CBC
HCT: 45 % (ref 39.0–52.0)
Hemoglobin: 14.9 g/dL (ref 13.0–17.0)
MCH: 30.8 pg (ref 26.0–34.0)
MCHC: 33.1 g/dL (ref 30.0–36.0)
MCV: 93.2 fL (ref 80.0–100.0)
Platelets: 227 10*3/uL (ref 150–400)
RBC: 4.83 MIL/uL (ref 4.22–5.81)
RDW: 13 % (ref 11.5–15.5)
WBC: 8.6 10*3/uL (ref 4.0–10.5)
nRBC: 0 % (ref 0.0–0.2)

## 2023-05-04 LAB — COMPREHENSIVE METABOLIC PANEL
ALT: 21 U/L (ref 0–44)
AST: 18 U/L (ref 15–41)
Albumin: 4.1 g/dL (ref 3.5–5.0)
Alkaline Phosphatase: 77 U/L (ref 38–126)
Anion gap: 11 (ref 5–15)
BUN: 11 mg/dL (ref 8–23)
CO2: 25 mmol/L (ref 22–32)
Calcium: 9 mg/dL (ref 8.9–10.3)
Chloride: 100 mmol/L (ref 98–111)
Creatinine, Ser: 1.02 mg/dL (ref 0.61–1.24)
GFR, Estimated: >60 mL/min (ref >60)
Glucose, Bld: 96 mg/dL (ref 70–99)
Potassium: 4.2 mmol/L (ref 3.5–5.1)
Sodium: 136 mmol/L (ref 135–145)
Total Bilirubin: 0.8 mg/dL (ref 0.3–1.2)
Total Protein: 7.2 g/dL (ref 6.5–8.1)

## 2023-05-04 LAB — I-STAT CHEM 8, ED
BUN: 13 mg/dL (ref 8–23)
Calcium, Ion: 1.19 mmol/L (ref 1.15–1.40)
Chloride: 100 mmol/L (ref 98–111)
Creatinine, Ser: 1 mg/dL (ref 0.61–1.24)
Glucose, Bld: 94 mg/dL (ref 70–99)
HCT: 46 % (ref 39.0–52.0)
Hemoglobin: 15.6 g/dL (ref 13.0–17.0)
Potassium: 4.2 mmol/L (ref 3.5–5.1)
Sodium: 138 mmol/L (ref 135–145)
TCO2: 26 mmol/L (ref 22–32)

## 2023-05-04 LAB — APTT: aPTT: 28 s (ref 24–36)

## 2023-05-04 LAB — PROTIME-INR
INR: 1 (ref 0.8–1.2)
Prothrombin Time: 13.1 s (ref 11.4–15.2)

## 2023-05-04 LAB — ETHANOL: Alcohol, Ethyl (B): <10 mg/dL (ref <10)

## 2023-05-04 NOTE — Consult Note (Signed)
Reason for Consult: Subdural hematoma Referring Physician: Emergency department  Frederick Watkins is an 77 y.o. male.  HPI: 77 year old male, presents with complaints of right lower extremity weakness.  Symptoms ongoing for the past week.  Patient complains that his leg feels heavy and he is limping when he walks.  Patient does note that he fell out of bed approximately 2 weeks ago but does not feel like he hit his head.  He denies any other episodes of head injury.  He is having no significant headache.  He is having no speech or language difficulty.  He is having no new neurocognitive issues.  He takes no anticoagulants or antiplatelet agents.  Head CT scan demonstrates evidence of a left convexity subacute subdural hematoma which is approximately 17 mm in diameter with some minimal mass effect upon the underlying left frontal lobe.  No evidence of fracture.  No evidence of infarct.  No evidence of parenchymal injury.  Past Medical History:  Diagnosis Date   Coronary artery calcification 12/06/2018   Coronary artery disease involving native coronary artery of native heart without angina pectoris 12/06/2018   Essential hypertension 12/06/2018   GERD (gastroesophageal reflux disease)    H/O hernia repair    History of cholecystectomy    Lacunar infarct    Left basal ganglia   Mixed hyperlipidemia 12/06/2018   Mucous cyst of finger    Left thumb   Right bundle branch block 12/06/2018   RLS (restless legs syndrome)     Past Surgical History:  Procedure Laterality Date   EAR CYST EXCISION  05/31/2012   Procedure: CYST REMOVAL;  Surgeon: Jodi Marble, MD;  Location: [facility];  Service: Orthopedics;  Laterality: Left;  Left Thumb Mucous Cyst Excision   RETINAL DETACHMENT REPAIR W/ SCLERAL BUCKLE LE  SEPT 2013   W/ CATARACT EXTRACTION WITH LENS IMPLANT , RIGHT EYE    Family History  Problem Relation Age of Onset   Dementia Mother        Unspecified type    Social  History:  reports that he has never smoked. He has never used smokeless tobacco. He reports current alcohol use of about 7.0 standard drinks of alcohol per week. He reports that he does not use drugs.  Allergies:  Allergies  Allergen Reactions   Crestor [Rosuvastatin] Swelling    TONGUE SWELLS    Medications: I have reviewed the patient's current medications.  Results for orders placed or performed during the hospital encounter of 05/04/23 (from the past 48 hour(s))  Protime-INR     Status: None   Collection Time: 05/04/23  6:15 PM  Result Value Ref Range   Prothrombin Time 13.1 11.4 - 15.2 seconds   INR 1.0 0.8 - 1.2    Comment: (NOTE) INR goal varies based on device and disease states. Performed at [facility], [address], [place], [place] [address]   APTT     Status: None   Collection Time: 05/04/23  6:15 PM  Result Value Ref Range   aPTT 28 24 - 36 seconds    Comment: Performed at [facility], [address] [place], [place] [address]  CBC     Status: None   Collection Time: 05/04/23  6:15 PM  Result Value Ref Range   WBC 8.6 4.0 - 10.5 K/uL   RBC 4.83 4.22 - 5.81 MIL/uL   Hemoglobin 14.9 13.0 - 17.0 g/dL   HCT 56.2 13.0 - 86.5 %  MCV 93.2 80.0 - 100.0 fL   MCH 30.8 26.0 - 34.0 pg   MCHC 33.1 30.0 - 36.0 g/dL   RDW 16.1 09.6 - 04.5 %   Platelets 227 150 - 400 K/uL   nRBC 0.0 0.0 - 0.2 %    Comment: Performed at [facility], [address], [place], [place] [address]  Differential     Status: None   Collection Time: 05/04/23  6:15 PM  Result Value Ref Range   Neutrophils Relative % 60 %   Neutro Abs 5.2 1.7 - 7.7 K/uL   Lymphocytes Relative 27 %   Lymphs Abs 2.3 0.7 - 4.0 K/uL   Monocytes Relative 11 %   Monocytes Absolute 0.9 0.1 - 1.0 K/uL   Eosinophils Relative 1 %   Eosinophils Absolute 0.1 0.0 - 0.5 K/uL   Basophils Relative 1 %   Basophils Absolute 0.0 0.0 - 0.1 K/uL   Immature Granulocytes 0 %   Abs Immature Granulocytes 0.02  0.00 - 0.07 K/uL    Comment: Performed at [facility], [address] [place], [place] [address]  Comprehensive metabolic panel     Status: None   Collection Time: 05/04/23  6:15 PM  Result Value Ref Range   Sodium 136 135 - 145 mmol/L   Potassium 4.2 3.5 - 5.1 mmol/L   Chloride 100 98 - 111 mmol/L   CO2 25 22 - 32 mmol/L   Glucose, Bld 96 70 - 99 mg/dL    Comment: Glucose reference range applies only to samples taken after fasting for at least 8 hours.   BUN 11 8 - 23 mg/dL   Creatinine, Ser 8.29 0.61 - 1.24 mg/dL   Calcium 9.0 8.9 - 56.2 mg/dL   Total Protein 7.2 6.5 - 8.1 g/dL   Albumin 4.1 3.5 - 5.0 g/dL   AST 18 15 - 41 U/L   ALT 21 0 - 44 U/L   Alkaline Phosphatase 77 38 - 126 U/L   Total Bilirubin 0.8 0.3 - 1.2 mg/dL   GFR, Estimated >13 >08 mL/min    Comment: (NOTE) Calculated using the CKD-EPI Creatinine Equation (2021)    Anion gap 11 5 - 15    Comment: Performed at [facility], [address] [place], [place] [address]  Ethanol     Status: None   Collection Time: 05/04/23  6:15 PM  Result Value Ref Range   Alcohol, Ethyl (B) <10 <10 mg/dL    Comment: (NOTE) Lowest detectable limit for serum alcohol is 10 mg/dL.  For medical purposes only. Performed at [facility], [address] [place], [place] [address]   I-stat chem 8, ED     Status: None   Collection Time: 05/04/23  6:20 PM  Result Value Ref Range   Sodium 138 135 - 145 mmol/L   Potassium 4.2 3.5 - 5.1 mmol/L   Chloride 100 98 - 111 mmol/L   BUN 13 8 - 23 mg/dL   Creatinine, Ser 5.28 0.61 - 1.24 mg/dL   Glucose, Bld 94 70 - 99 mg/dL    Comment: Glucose reference range applies only to samples taken after fasting for at least 8 hours.   Calcium, Ion 1.19 1.15 - 1.40 mmol/L   TCO2 26 22 - 32 mmol/L   Hemoglobin 15.6 13.0 - 17.0 g/dL   HCT 41.3 24.4 - 01.0 %    CT HEAD WO CONTRAST ( )  Addendum Date: 05/04/2023  ADDENDUM REPORT: 05/04/2023 16:43 ADDENDUM: Critical  Value/emergent results were called by telephone at the time of interpretation on 05/04/2023 at 4:42 pm to provider [facility] , who verbally acknowledged these results. Electronically Signed   By: Orvan Falconer M.D.   On: 05/04/2023 16:43   Result Date: 05/04/2023 CLINICAL DATA:  Right leg weakness. EXAM: CT HEAD WITHOUT CONTRAST TECHNIQUE: Contiguous axial images were obtained from the base of the skull through the vertex without intravenous contrast. RADIATION DOSE REDUCTION: This exam was performed according to the departmental dose-optimization program which includes automated exposure control, adjustment of the mA and/or kV according to patient size and/or use of iterative reconstruction technique. COMPARISON:  Head CT 12/06/2022.  MRI brain 12/06/2022. FINDINGS: Brain: Mixed attenuation left frontoparietal convexity subdural hematoma, measuring up to 17 mm (coronal image 53 series 601) with 9 mm of associated rightward midline shift. Stable background of mild chronic small-vessel disease. No new loss of gray-white differentiation. No hydrocephalus. Vascular: No hyperdense vessel or unexpected calcification. Skull: No calvarial fracture or suspicious bone lesion. Skull base is unremarkable. Sinuses/Orbits: No acute finding. Other: None. IMPRESSION: Mixed attenuation left frontoparietal convexity subdural hematoma, measuring up to 17 mm with 9 mm of associated rightward midline shift. Radiology assistant personnel have been notified to put me in telephone contact with the referring physician or the referring physician's clinical representative in order to discuss these findings. Once this communication is established I will issue an addendum to this report for documentation purposes. Electronically Signed: By: Orvan Falconer M.D. On: 05/04/2023 16:28    Pertinent items noted in HPI and remainder of comprehensive ROS otherwise negative. Blood pressure 135/88, pulse 74, temperature 98.5 F (36.9 C),  temperature source Oral, resp. rate 14, height 6' (1.829 m), weight 94.3 kg, SpO2 95%. Patient is awake and alert.  He is oriented and appropriate.  His speech is fluent.  His judgment insight are intact.  His cranial nerve function is normal bilaterally motor examination 5/5 in both upper extremities with no pronator drift.  Perhaps there is some very minimal weakness in his right lower extremity although I cannot appreciate this on direct testing.  Sensory examination is nonfocal.  Examination head ears eyes nose and throat is unremarked.  Chest and abdomen are benign.  Extremities are free from injury or deformity.  Assessment/Plan: Left convexity subacute subdural hematoma with minimal mass effect and some mild complaints of right lower extremity weakness which is not progressive.  I discussed situation with patient and his wife.  I feel that he be best managed by middle meningeal artery embolization which can be done as an outpatient.  I will arrange for him to be followed up with Dr. Conchita Paris in office to discuss procedure and we will work towards getting this done over the next week.  Kathaleen Maser Vasilisa Vore 05/04/2023, 8:15 PM

## 2023-05-04 NOTE — ED Triage Notes (Signed)
Right leg feels heavy for 1 week. Pt is not on blood thinners. Rolled over off the bed to the ground 2 weeks ago. Denies hitting head. Wife states pt has been acting strange since the fall with mood changes. Alert and oriented x 4.

## 2023-05-04 NOTE — Discharge Instructions (Addendum)
Light activity.  No driving.  No lifting more than 5 pounds.  Avoid aspirin products.  Call [phone] tomorrow to arrange appoint with Dr. Conchita Paris for middle meningeal artery embolization.

## 2023-05-04 NOTE — ED Provider Notes (Signed)
 [place] EMERGENCY DEPARTMENT AT [facility] Provider Note   CSN: [id number] Arrival date & time: 05/04/23  1746     History Chief Complaint  Patient presents with   Weakness    Frederick Watkins is a 77 y.o. male.  Patient presents the emergency department concerns of weakness.  Reports that he been having right-leg weakness for the last week or so.  No recent fall and patient is not currently on blood thinners.  States that he fell off his bed about 2 weeks ago and maybe had hit his head at that time but denies any significant injury during that time and did not seek an evaluation.  Patient's wife does report that he has been having some change in his behavior in the last week or so since his legs are giving him trouble with increasing frustration and otherwise being more agitated.  Denies bowel or bladder incontinence recently.  No low back pain or any final trauma.   Weakness      Home Medications Prior to Admission medications   Medication Sig Start Date End Date Taking? Authorizing Provider  Ascorbic Acid (VITAMIN C) 1000 MG tablet Take 1,000 mg by mouth 2 (two) times a day.    [provider]  aspirin EC 81 MG tablet Take 81 mg by mouth daily.    [provider]  atorvastatin (LIPITOR) 40 MG tablet Take 40 mg by mouth daily.    [provider]  ezetimibe (ZETIA) 10 MG tablet Take 1 tablet (10 mg total) by mouth daily. 12/29/22   Jake Bathe, MD  gabapentin (NEURONTIN) 100 MG capsule Take 2 capsules every night 10/08/20   Van Clines, MD  lisinopril (PRINIVIL,ZESTRIL) 20 MG tablet Take 20 mg by mouth every evening.    [provider]  LORazepam (ATIVAN) 1 MG tablet Take 1 mg by mouth daily as needed. 03/22/20   [provider]  meclizine (ANTIVERT) 25 MG tablet Take 1 tablet (25 mg total) by mouth 3 (three) times daily as needed for up to 30 doses for dizziness. 12/06/22   Terald Sleeper, MD  pantoprazole (PROTONIX) 40  MG tablet Take 40 mg by mouth 2 (two) times daily. 12/09/19   [provider]  sertraline (ZOLOFT) 100 MG tablet Take 100 mg by mouth daily.    [provider]      Allergies    Crestor [rosuvastatin]    Review of Systems   Review of Systems  Neurological:  Positive for weakness.  All other systems reviewed and are negative.   Physical Exam Updated Vital Signs BP (!) 150/92   Pulse 85   Temp 98.5 F (36.9 C) (Oral)   Resp 14   Ht 6' (1.829 m)   Wt 94.3 kg   SpO2 96%   BMI 28.21 kg/m  Physical Exam Vitals and nursing note reviewed.  Constitutional:      General: He is not in acute distress.    Appearance: He is well-developed.  HENT:     Head: Normocephalic and atraumatic.  Eyes:     Conjunctiva/sclera: Conjunctivae normal.  Cardiovascular:     Rate and Rhythm: Normal rate and regular rhythm.     Heart sounds: No murmur heard. Pulmonary:     Effort: Pulmonary effort is normal. No respiratory distress.     Breath sounds: Normal breath sounds.  Abdominal:     Palpations: Abdomen is soft.     Tenderness: There is no abdominal  tenderness.  Musculoskeletal:        General: No swelling.     Cervical back: Neck supple.  Skin:    General: Skin is warm and dry.     Capillary Refill: Capillary refill takes less than 2 seconds.  Neurological:     General: No focal deficit present.     Mental Status: He is alert and oriented to person, place, and time. Mental status is at baseline.     Cranial Nerves: No cranial nerve deficit.     Motor: Weakness present.     Comments: No appreciable facial droop, or upper extremity weakness.  Lower extremity with notable weakness with plantarflexion of the right foot with 3 out of 5 strength compared to the left foot being 5 out of 5.  Sensation intact and no paresthesias present.  Psychiatric:        Mood and Affect: Mood normal.     ED Results / Procedures / Treatments   Labs (all labs ordered are listed, but only  abnormal results are displayed) Labs Reviewed  PROTIME-INR  APTT  CBC  DIFFERENTIAL  COMPREHENSIVE METABOLIC PANEL  ETHANOL  I-STAT CHEM 8, ED    EKG None  Radiology CT HEAD WO CONTRAST ( )  Addendum Date: 05/04/2023   ADDENDUM REPORT: 05/04/2023 16:43 ADDENDUM: Critical Value/emergent results were called by telephone at the time of interpretation on 05/04/2023 at 4:42 pm to provider [facility] , who verbally acknowledged these results. Electronically Signed   By: Orvan Falconer M.D.   On: 05/04/2023 16:43   Result Date: 05/04/2023 CLINICAL DATA:  Right leg weakness. EXAM: CT HEAD WITHOUT CONTRAST TECHNIQUE: Contiguous axial images were obtained from the base of the skull through the vertex without intravenous contrast. RADIATION DOSE REDUCTION: This exam was performed according to the departmental dose-optimization program which includes automated exposure control, adjustment of the mA and/or kV according to patient size and/or use of iterative reconstruction technique. COMPARISON:  Head CT 12/06/2022.  MRI brain 12/06/2022. FINDINGS: Brain: Mixed attenuation left frontoparietal convexity subdural hematoma, measuring up to 17 mm (coronal image 53 series 601) with 9 mm of associated rightward midline shift. Stable background of mild chronic small-vessel disease. No new loss of gray-white differentiation. No hydrocephalus. Vascular: No hyperdense vessel or unexpected calcification. Skull: No calvarial fracture or suspicious bone lesion. Skull base is unremarkable. Sinuses/Orbits: No acute finding. Other: None. IMPRESSION: Mixed attenuation left frontoparietal convexity subdural hematoma, measuring up to 17 mm with 9 mm of associated rightward midline shift. Radiology assistant personnel have been notified to put me in telephone contact with the referring physician or the referring physician's clinical representative in order to discuss these findings. Once this communication is established  I will issue an addendum to this report for documentation purposes. Electronically Signed: By: Orvan Falconer M.D. On: 05/04/2023 16:28    Procedures Procedures   Medications Ordered in ED Medications - No data to display  ED Course/ Medical Decision Making/ A&P                               Medical Decision Making Amount and/or Complexity of Data Reviewed Labs: ordered.   This patient presents to the ED for concern of weakness.  Differential diagnosis includes subdural hematoma, stroke, MS, peroneal nerve palsy   Lab Tests:  I Ordered, and personally interpreted labs.  The pertinent results include: Fifth CBC, CMP, APTT, pro time INR, ethanol all reassuring  with no acute abnormalities noted   Imaging Studies ordered:  I ordered imaging studies including CT head from 05/04/2023 performed outpatient I independently visualized and interpreted imaging which showed critical finding of mixed attenuation left frontal parietal convexity subdural hematoma measuring up to 17 mm with 9 mm of associated rightward midline shift I agree with the radiologist interpretation   Medicines ordered and prescription drug management:  I ordered medication including ***  for ***  Reevaluation of the patient after these medicines showed that the patient {resolved/improved/worsened:23923::"improved"} I have reviewed the patients home medicines and have made adjustments as needed   Problem List / ED Course:  *** Spoke with Dr. Jordan Likes, neurosurgery, regarding patient case. Advised that he will come in to evaluate patient shortly. No acute indication for other interventions at this time. Dr. Jordan Likes assessed patient and feels that he would be best managed by middle meningeal artery embolization which can be done outpatient. Will ensure that patient is aware and comfortable with this plan.   Social Determinants of Health:       {Document critical care time when appropriate:1} {Document review of  labs and clinical decision tools ie heart score, Chads2Vasc2 etc:1}  {Document your independent review of radiology images, and any outside records:1} {Document your discussion with family members, caretakers, and with consultants:1} {Document social determinants of health affecting pt's care:1} {Document your decision making why or why not admission, treatments were needed:1} Final Clinical Impression(s) / ED Diagnoses Final diagnoses:  None    Rx / DC Orders ED Discharge Orders     None

## 2023-05-04 NOTE — ED Notes (Addendum)
EDPA aware neuro wants systolic below 150

## 2023-05-07 ENCOUNTER — Other Ambulatory Visit (HOSPITAL_COMMUNITY): Payer: Self-pay | Admitting: Neurosurgery

## 2023-05-07 DIAGNOSIS — I6203 Nontraumatic chronic subdural hemorrhage: Secondary | ICD-10-CM

## 2023-05-11 ENCOUNTER — Encounter (HOSPITAL_COMMUNITY): Payer: Self-pay | Admitting: Neurosurgery

## 2023-05-11 NOTE — Progress Notes (Signed)
Patient and wife voiced understanding of pre-op instructions.   Arrive to [facility] 05/12/2023 at 1000 DO take lipitor, zetia, gabapentin, ativan (as needed), meclizine (as needed), protonix, zoloft, Methylprednisolone, Levetiracetam. Do NOT take aspirin, lisinopril, vitamin C. Nothing to eat or drink after midnight. Brush teeth and shower.  Do not bring valuables to the hospital.   Patient started taking Methylprednisolone and Levetiracetam on 05/11/23. Patient called Dr. Val Riles office on 11/3 about "trouble with speech." Spoke with Dr. Jake Samples who was on call. He prescribed Levetiracetam 500mg , Methylprednisolone 4mg . Anesthesia is aware.

## 2023-05-12 ENCOUNTER — Inpatient Hospital Stay (HOSPITAL_COMMUNITY): Payer: Medicare Other | Admitting: Anesthesiology

## 2023-05-12 ENCOUNTER — Inpatient Hospital Stay (HOSPITAL_COMMUNITY)
Admission: RE | Admit: 2023-05-12 | Discharge: 2023-05-12 | Disposition: A | Discharge status: Home / Self Care | Payer: Medicare Other | Source: Ambulatory Visit | Attending: Neurosurgery | Admitting: Neurosurgery

## 2023-05-12 ENCOUNTER — Encounter (HOSPITAL_COMMUNITY)
Admission: RE | Disposition: A | Discharge status: Home / Self Care | Payer: Self-pay | Source: Home / Self Care | Attending: Neurosurgery

## 2023-05-12 ENCOUNTER — Other Ambulatory Visit: Payer: Self-pay

## 2023-05-12 ENCOUNTER — Inpatient Hospital Stay (HOSPITAL_COMMUNITY)
Admission: RE | Admit: 2023-05-12 | Discharge: 2023-05-13 | DRG: 022 | Disposition: A | Discharge status: Home / Self Care | Payer: Medicare Other | Attending: Neurosurgery | Admitting: Neurosurgery

## 2023-05-12 ENCOUNTER — Encounter (HOSPITAL_COMMUNITY): Payer: Self-pay | Admitting: Neurosurgery

## 2023-05-12 DIAGNOSIS — Z9049 Acquired absence of other specified parts of digestive tract: Secondary | ICD-10-CM | POA: Diagnosis not present

## 2023-05-12 DIAGNOSIS — W06XXXA Fall from bed, initial encounter: Secondary | ICD-10-CM | POA: Diagnosis present

## 2023-05-12 DIAGNOSIS — K219 Gastro-esophageal reflux disease without esophagitis: Secondary | ICD-10-CM | POA: Diagnosis not present

## 2023-05-12 DIAGNOSIS — Z8673 Personal history of transient ischemic attack (TIA), and cerebral infarction without residual deficits: Secondary | ICD-10-CM

## 2023-05-12 DIAGNOSIS — I10 Essential (primary) hypertension: Secondary | ICD-10-CM | POA: Diagnosis present

## 2023-05-12 DIAGNOSIS — R29700 NIHSS score 0: Secondary | ICD-10-CM | POA: Diagnosis not present

## 2023-05-12 DIAGNOSIS — I4510 Unspecified right bundle-branch block: Secondary | ICD-10-CM | POA: Diagnosis not present

## 2023-05-12 DIAGNOSIS — I2510 Atherosclerotic heart disease of native coronary artery without angina pectoris: Secondary | ICD-10-CM | POA: Diagnosis present

## 2023-05-12 DIAGNOSIS — G2581 Restless legs syndrome: Secondary | ICD-10-CM | POA: Diagnosis not present

## 2023-05-12 DIAGNOSIS — I6203 Nontraumatic chronic subdural hemorrhage: Secondary | ICD-10-CM

## 2023-05-12 DIAGNOSIS — S065XAA Traumatic subdural hemorrhage with loss of consciousness status unknown, initial encounter: Secondary | ICD-10-CM | POA: Diagnosis present

## 2023-05-12 DIAGNOSIS — Y92009 Unspecified place in unspecified non-institutional (private) residence as the place of occurrence of the external cause: Secondary | ICD-10-CM | POA: Diagnosis not present

## 2023-05-12 DIAGNOSIS — E782 Mixed hyperlipidemia: Secondary | ICD-10-CM | POA: Diagnosis not present

## 2023-05-12 DIAGNOSIS — Z7982 Long term (current) use of aspirin: Secondary | ICD-10-CM | POA: Diagnosis not present

## 2023-05-12 DIAGNOSIS — Z79899 Other long term (current) drug therapy: Secondary | ICD-10-CM

## 2023-05-12 HISTORY — PX: IR ANGIOGRAM FOLLOW UP STUDY: IMG697

## 2023-05-12 HISTORY — PX: IR TRANSCATH/EMBOLIZ: IMG695

## 2023-05-12 HISTORY — DX: Headache, unspecified: R51.9

## 2023-05-12 HISTORY — PX: IR NEURO EACH ADD'L AFTER BASIC UNI LEFT (MS): IMG5373

## 2023-05-12 HISTORY — PX: IR ANGIO EXTERNAL CAROTID SEL EXT CAROTID UNI L MOD SED: IMG5370

## 2023-05-12 HISTORY — PX: IR ANGIO INTRA EXTRACRAN SEL INTERNAL CAROTID UNI L MOD SED: IMG5361

## 2023-05-12 HISTORY — PX: RADIOLOGY WITH ANESTHESIA: SHX6223

## 2023-05-12 SURGERY — IR WITH ANESTHESIA
Anesthesia: General

## 2023-05-12 MED ORDER — FENTANYL CITRATE (PF) 100 MCG/2ML IJ SOLN
INTRAMUSCULAR | Status: AC
  Filled 2023-05-12: qty 2

## 2023-05-12 MED ORDER — HEPARIN SODIUM (PORCINE) 1000 UNIT/ML IJ SOLN
INTRAMUSCULAR | Status: DC | PRN
  Administered 2023-05-12: 2000 [IU] via INTRAVENOUS

## 2023-05-12 MED ORDER — ONDANSETRON HCL 4 MG PO TABS: 4.0000 mg | ORAL_TABLET | ORAL | Status: DC | PRN

## 2023-05-12 MED ORDER — LEVETIRACETAM 500 MG PO TABS
500.0000 mg | ORAL_TABLET | Freq: Two times a day (BID) | ORAL | Status: DC
  Administered 2023-05-12 – 2023-05-13 (×2): 500 mg via ORAL
  Filled 2023-05-12 (×2): qty 1

## 2023-05-12 MED ORDER — IOHEXOL 300 MG/ML  SOLN
150.0000 mL | Freq: Once | INTRAMUSCULAR | Status: AC | PRN
  Administered 2023-05-12: 35 mL via INTRA_ARTERIAL

## 2023-05-12 MED ORDER — LACTATED RINGERS IV SOLN: INTRAVENOUS | Status: DC | PRN

## 2023-05-12 MED ORDER — ONDANSETRON HCL 4 MG/2ML IJ SOLN: 4.0000 mg | INTRAMUSCULAR | Status: DC | PRN

## 2023-05-12 MED ORDER — PANTOPRAZOLE SODIUM 40 MG PO TBEC
40.0000 mg | DELAYED_RELEASE_TABLET | Freq: Two times a day (BID) | ORAL | Status: DC
  Administered 2023-05-12 – 2023-05-13 (×2): 40 mg via ORAL
  Filled 2023-05-12 (×2): qty 1

## 2023-05-12 MED ORDER — CLEVIDIPINE BUTYRATE 0.5 MG/ML IV EMUL
INTRAVENOUS | Status: DC | PRN
  Administered 2023-05-12: 2 mg/h via INTRAVENOUS

## 2023-05-12 MED ORDER — ATORVASTATIN CALCIUM 40 MG PO TABS
40.0000 mg | ORAL_TABLET | Freq: Every day | ORAL | Status: DC
  Administered 2023-05-12 – 2023-05-13 (×2): 40 mg via ORAL
  Filled 2023-05-12 (×2): qty 1

## 2023-05-12 MED ORDER — LEVETIRACETAM 500 MG PO TABS
500.0000 mg | ORAL_TABLET | Freq: Once | ORAL | Status: AC
  Administered 2023-05-12: 500 mg via ORAL
  Filled 2023-05-12: qty 1

## 2023-05-12 MED ORDER — FENTANYL CITRATE (PF) 100 MCG/2ML IJ SOLN: 25.0000 ug | INTRAMUSCULAR | Status: DC | PRN

## 2023-05-12 MED ORDER — METHYLPREDNISOLONE 4 MG PO TABS
4.0000 mg | ORAL_TABLET | Freq: Every day | ORAL | Status: DC
  Administered 2023-05-12: 4 mg via ORAL
  Filled 2023-05-12: qty 1

## 2023-05-12 MED ORDER — SUGAMMADEX SODIUM 200 MG/2ML IV SOLN
INTRAVENOUS | Status: DC | PRN
  Administered 2023-05-12: 200 mg via INTRAVENOUS

## 2023-05-12 MED ORDER — CHLORHEXIDINE GLUCONATE 0.12 % MT SOLN
15.0000 mL | Freq: Once | OROMUCOSAL | Status: AC
Start: 2023-05-12 — End: 2023-05-12
  Administered 2023-05-12: 15 mL via OROMUCOSAL

## 2023-05-12 MED ORDER — ORAL CARE MOUTH RINSE: 15.0000 mL | Freq: Once | OROMUCOSAL | Status: AC

## 2023-05-12 MED ORDER — PROPOFOL 10 MG/ML IV BOLUS
INTRAVENOUS | Status: DC | PRN
  Administered 2023-05-12: 150 mg via INTRAVENOUS
  Administered 2023-05-12: 50 mg via INTRAVENOUS

## 2023-05-12 MED ORDER — EZETIMIBE 10 MG PO TABS
10.0000 mg | ORAL_TABLET | Freq: Every day | ORAL | Status: DC
  Administered 2023-05-12 – 2023-05-13 (×2): 10 mg via ORAL
  Filled 2023-05-12 (×2): qty 1

## 2023-05-12 MED ORDER — FENTANYL CITRATE (PF) 250 MCG/5ML IJ SOLN
INTRAMUSCULAR | Status: DC | PRN
  Administered 2023-05-12: 100 ug via INTRAVENOUS

## 2023-05-12 MED ORDER — HYDROCODONE-ACETAMINOPHEN 5-325 MG PO TABS
1.0000 | ORAL_TABLET | ORAL | Status: DC | PRN
  Administered 2023-05-13: 1 via ORAL
  Filled 2023-05-12: qty 1

## 2023-05-12 MED ORDER — SERTRALINE HCL 100 MG PO TABS
200.0000 mg | ORAL_TABLET | Freq: Every day | ORAL | Status: DC
  Administered 2023-05-12 – 2023-05-13 (×2): 200 mg via ORAL
  Filled 2023-05-12 (×2): qty 2

## 2023-05-12 MED ORDER — PHENYLEPHRINE HCL-NACL 20-0.9 MG/250ML-% IV SOLN
INTRAVENOUS | Status: DC | PRN
  Administered 2023-05-12: 20 ug/min via INTRAVENOUS

## 2023-05-12 MED ORDER — ONDANSETRON HCL 4 MG/2ML IJ SOLN
INTRAMUSCULAR | Status: DC | PRN
  Administered 2023-05-12: 4 mg via INTRAVENOUS

## 2023-05-12 MED ORDER — LIDOCAINE 2% (20 MG/ML) 5 ML SYRINGE
INTRAMUSCULAR | Status: DC | PRN
  Administered 2023-05-12: 100 mg via INTRAVENOUS

## 2023-05-12 MED ORDER — ACETAMINOPHEN 10 MG/ML IV SOLN: 1000.0000 mg | Freq: Once | INTRAVENOUS | Status: DC | PRN

## 2023-05-12 MED ORDER — LORAZEPAM 1 MG PO TABS: 1.0000 mg | ORAL_TABLET | Freq: Every day | ORAL | Status: DC | PRN

## 2023-05-12 MED ORDER — LIDOCAINE HCL 1 % IJ SOLN
INTRAMUSCULAR | Status: AC
Start: 2023-05-12 — End: ?
  Filled 2023-05-12: qty 20

## 2023-05-12 MED ORDER — ROCURONIUM BROMIDE 10 MG/ML (PF) SYRINGE
PREFILLED_SYRINGE | INTRAVENOUS | Status: DC | PRN
  Administered 2023-05-12: 70 mg via INTRAVENOUS

## 2023-05-12 MED ORDER — LISINOPRIL 20 MG PO TABS: 20.0000 mg | ORAL_TABLET | Freq: Every evening | ORAL | Status: DC

## 2023-05-12 MED ORDER — VITAMIN C 500 MG PO TABS
1000.0000 mg | ORAL_TABLET | Freq: Every day | ORAL | Status: DC
  Administered 2023-05-12 – 2023-05-13 (×2): 1000 mg via ORAL
  Filled 2023-05-12 (×3): qty 2

## 2023-05-12 MED ORDER — CHLORHEXIDINE GLUCONATE 0.12 % MT SOLN
OROMUCOSAL | Status: AC
  Filled 2023-05-12: qty 15

## 2023-05-12 MED ORDER — LABETALOL HCL 5 MG/ML IV SOLN: 10.0000 mg | INTRAVENOUS | Status: DC | PRN

## 2023-05-12 MED ORDER — DEXAMETHASONE SODIUM PHOSPHATE 10 MG/ML IJ SOLN
INTRAMUSCULAR | Status: DC | PRN
  Administered 2023-05-12: 5 mg via INTRAVENOUS

## 2023-05-12 MED ORDER — TRAZODONE HCL 50 MG PO TABS: 50.0000 mg | ORAL_TABLET | Freq: Every evening | ORAL | Status: DC | PRN

## 2023-05-12 MED ORDER — GABAPENTIN 100 MG PO CAPS
100.0000 mg | ORAL_CAPSULE | Freq: Two times a day (BID) | ORAL | Status: DC
  Administered 2023-05-12 – 2023-05-13 (×2): 100 mg via ORAL
  Filled 2023-05-12 (×2): qty 1

## 2023-05-12 NOTE — Anesthesia Postprocedure Evaluation (Signed)
Anesthesia Post Note  Patient: Frederick Watkins  Procedure(s) Performed: IR WITH ANESTHESIA     Patient location during evaluation: PACU Anesthesia Type: General Level of consciousness: awake and alert Pain management: pain level controlled Vital Signs Assessment: post-procedure vital signs reviewed and stable Respiratory status: spontaneous breathing, nonlabored ventilation, respiratory function stable and patient connected to nasal cannula oxygen Cardiovascular status: blood pressure returned to baseline and stable Postop Assessment: no apparent nausea or vomiting Anesthetic complications: no   No notable events documented.  Last Vitals:  Vitals:   05/12/23 1515 05/12/23 1530  BP: 136/78   Pulse: 71 62  Resp: 15 12  Temp:    SpO2: 100% 100%    Last Pain:  Vitals:   05/12/23 1515  TempSrc:   PainSc: 0-No pain                 Hopkins Nation

## 2023-05-12 NOTE — Op Note (Signed)
  NEUROSURGERY BRIEF OPERATIVE  NOTE   PREOP DX: Left chronic SDH  POSTOP DX: Same  PROCEDURE:Onyx Embolization of left middle meningeal artery  SURGEON: Dr. Lisbeth Renshaw, MD  ANESTHESIA: GETA  APPROACH: Right trans-femoral  EBL: Minimal  SPECIMENS: None  COMPLICATIONS: None  CONDITION: Stable to recovery  FINDINGS (Full report in [facility]): 1. Successful Onyx embolization of left middle meningeal artery   Lisbeth Renshaw, MD [facility]

## 2023-05-12 NOTE — Sedation Documentation (Signed)
Pt arrived to procedure room 3, moved to table by himself, secured to table and monitor by staff, prepped sterilely supine. Under the care of general anesthesia, please see CRNA charting for vitals and patient care.

## 2023-05-12 NOTE — Anesthesia Procedure Notes (Signed)
Arterial Line Insertion Start/End11/11/2022 12:30 PM, 05/12/2023 12:37 PM Performed by: Alease Medina, CRNA, CRNA  Preanesthetic checklist: patient identified, IV checked, site marked, risks and benefits discussed, surgical consent, monitors and equipment checked and pre-op evaluation Lidocaine 1% used for infiltration Left, radial was placed Catheter size: 20 G Hand hygiene performed  and maximum sterile barriers used  Allen's test indicative of satisfactory collateral circulation Attempts: 1 Procedure performed without using ultrasound guided technique.

## 2023-05-12 NOTE — Sedation Documentation (Signed)
Heparin 2000 U ordered by MD, given by CRNA

## 2023-05-12 NOTE — Anesthesia Preprocedure Evaluation (Addendum)
Anesthesia Evaluation  Patient identified by MRN, date of birth, ID band Patient awake    Reviewed: Allergy & Precautions, NPO status , Patient's Chart, lab work & pertinent test results  Airway Mallampati: II  TM Distance: >3 FB Neck ROM: Full    Dental no notable dental hx.    Pulmonary neg pulmonary ROS   Pulmonary exam normal        Cardiovascular hypertension, Pt. on medications + CAD  + dysrhythmias  Rhythm:Regular Rate:Normal     Neuro/Psych  Headaches  Anxiety     Planned MMA embolization 2/2 subdural hematoma from fall 10/28    GI/Hepatic Neg liver ROS,GERD  Medicated,,  Endo/Other  negative endocrine ROS    Renal/GU negative Renal ROS  negative genitourinary   Musculoskeletal negative musculoskeletal ROS (+)    Abdominal Normal abdominal exam  (+)   Peds  Hematology Lab Results      Component                Value               Date                      WBC                      8.6                 05/04/2023                HGB                      15.6                05/04/2023                HCT                      46.0                05/04/2023                MCV                      93.2                05/04/2023                PLT                      227                 05/04/2023             Lab Results      Component                Value               Date                      NA                       138                 05/04/2023                K  4.2                 05/04/2023                CO2                      25                  05/04/2023                GLUCOSE                  94                  05/04/2023                BUN                      13                  05/04/2023                CREATININE               1.00                05/04/2023                CALCIUM                  9.0                 05/04/2023                GFRNONAA                 >60                  05/04/2023              Anesthesia Other Findings   Reproductive/Obstetrics                             Anesthesia Physical Anesthesia Plan  ASA: 3  Anesthesia Plan: General   Post-op Pain Management:    Induction: Intravenous  PONV Risk Score and Plan: 2 and Ondansetron, Dexamethasone and Treatment may vary due to age or medical condition  Airway Management Planned: Mask and Oral ETT  Additional Equipment: Arterial line  Intra-op Plan:   Post-operative Plan: Extubation in OR  Informed Consent: I have reviewed the patients History and Physical, chart, labs and discussed the procedure including the risks, benefits and alternatives for the proposed anesthesia with the patient or authorized representative who has indicated his/her understanding and acceptance.     Dental advisory given  Plan Discussed with: CRNA  Anesthesia Plan Comments:        Anesthesia Quick Evaluation

## 2023-05-12 NOTE — Transfer of Care (Signed)
Immediate Anesthesia Transfer of Care Note  Patient: Frederick Watkins  Procedure(s) Performed: IR WITH ANESTHESIA  Patient Location: PACU  Anesthesia Type:General  Level of Consciousness: awake, alert , oriented, and patient cooperative  Airway & Oxygen Therapy: Patient Spontanous Breathing and Patient connected to face mask oxygen  Post-op Assessment: Report given to RN and Post -op Vital signs reviewed and stable  Post vital signs: Reviewed and stable  Last Vitals:  Vitals Value Taken Time  BP 123/109 05/12/23 1450  Temp    Pulse 81 05/12/23 1459  Resp 13 05/12/23 1459  SpO2 89 % 05/12/23 1459  Vitals shown include unfiled device data.  Last Pain:  Vitals:   05/12/23 1034  TempSrc:   PainSc: 0-No pain      Patients Stated Pain Goal: 0 (05/12/23 1034)  Complications: No notable events documented.

## 2023-05-12 NOTE — Anesthesia Procedure Notes (Signed)
Procedure Name: Intubation Date/Time: 05/12/2023 1:31 PM  Performed by: Orlin Hilding, CRNAPre-anesthesia Checklist: Patient identified, Emergency Drugs available, Suction available, Patient being monitored and Timeout performed Patient Re-evaluated:Patient Re-evaluated prior to induction Oxygen Delivery Method: Circle system utilized Preoxygenation: Pre-oxygenation with 100% oxygen Induction Type: IV induction Ventilation: Mask ventilation without difficulty and Oral airway inserted - appropriate to patient size Laryngoscope Size: Mac and 4 Grade View: Grade I Tube type: Oral Tube size: 7.5 mm Number of attempts: 1 Placement Confirmation: ETT inserted through vocal cords under direct vision, positive ETCO2 and breath sounds checked- equal and bilateral Secured at: 23 cm Tube secured with: Tape Dental Injury: Teeth and Oropharynx as per pre-operative assessment

## 2023-05-12 NOTE — Sedation Documentation (Signed)
Closure device deployed

## 2023-05-12 NOTE — H&P (Signed)
Chief Complaint   Subdural hematoma  History of Present Illness  Mr. Frederick Watkins is a 77 year old man I have seen for subdural hematoma. Briefly, the patient noted onset of some mild right leg weakness as he noted himself dragging his right leg while walking. He recalls falling out of bed a few weeks ago although does not think that he hit his head. Nonetheless, as he was dragging his leg his primary care doctor ordered a CT scan demonstrating a chronic left-sided subdural hematoma. He was referred to the emergency department and subsequently for outpatient cerebrovascular follow-up. Upon questioning, the patient does not report any right upper extremity symptoms. No left-sided symptoms. No significant headache. No visual changes.  Of note, the patient reports a history of dyslipidemia. No history of heart attack or stroke. No known lung, liver, kidney disease. He takes a baby aspirin but no other blood thinners. He is a nonsmoker   Past Medical History   Past Medical History:  Diagnosis Date   Coronary artery calcification 12/06/2018   Coronary artery disease involving native coronary artery of native heart without angina pectoris 12/06/2018   Essential hypertension 12/06/2018   GERD (gastroesophageal reflux disease)    H/O hernia repair    Headache    History of cholecystectomy    Lacunar infarct    Left basal ganglia   Mixed hyperlipidemia 12/06/2018   Mucous cyst of finger    Left thumb   Right bundle branch block 12/06/2018   RLS (restless legs syndrome)     Past Surgical History   Past Surgical History:  Procedure Laterality Date   CHOLECYSTECTOMY  1999   EAR CYST EXCISION  05/31/2012   Procedure: CYST REMOVAL;  Surgeon: Jodi Marble, MD;  Location: [facility];  Service: Orthopedics;  Laterality: Left;  Left Thumb Mucous Cyst Excision   HERNIA REPAIR  1994   RETINAL DETACHMENT REPAIR W/ SCLERAL BUCKLE LE  03/07/2012   W/ CATARACT EXTRACTION WITH LENS  IMPLANT , RIGHT EYE    Social History   Social History   Tobacco Use   Smoking status: Never   Smokeless tobacco: Never  Vaping Use   Vaping status: Never Used  Substance Use Topics   Alcohol use: Yes    Alcohol/week: 7.0 standard drinks of alcohol    Types: 7 Glasses of wine per week   Drug use: No    Medications   Prior to Admission medications   Medication Sig Start Date End Date Taking? Authorizing Provider  Ascorbic Acid (VITAMIN C) 1000 MG tablet Take 1,000 mg by mouth daily.   Yes [provider]  aspirin EC 81 MG tablet Take 81 mg by mouth daily.   Yes [provider]  atorvastatin (LIPITOR) 40 MG tablet Take 40 mg by mouth daily.   Yes [provider]  ezetimibe (ZETIA) 10 MG tablet Take 1 tablet (10 mg total) by mouth daily. 12/29/22  Yes Jake Bathe, MD  gabapentin (NEURONTIN) 100 MG capsule Take 2 capsules every night Patient taking differently: Take 100 mg by mouth 2 (two) times daily. 10/08/20  Yes Van Clines, MD  levETIRAcetam (KEPPRA) 500 MG tablet Take 500 mg by mouth 2 (two) times daily.   Yes [provider]  lisinopril (PRINIVIL,ZESTRIL) 20 MG tablet Take 20 mg by mouth every evening.   Yes [provider]  LORazepam (ATIVAN) 1 MG tablet Take 1 mg by mouth as needed for anxiety. 03/22/20  Yes [provider]  methylPREDNISolone (MEDROL DOSEPAK) 4 MG TBPK tablet Take by mouth See admin instructions. Take per taper instructions   Yes [provider]  pantoprazole (PROTONIX) 40 MG tablet Take 40 mg by mouth 2 (two) times daily. 12/09/19  Yes [provider]  sertraline (ZOLOFT) 100 MG tablet Take 200 mg by mouth daily.   Yes [provider]  traZODone (DESYREL) 50 MG tablet Take 50 mg by mouth at bedtime as needed for sleep.   Yes [provider]  meclizine (ANTIVERT) 25 MG tablet Take 1 tablet (25 mg total) by mouth 3 (three) times daily as needed for up to 30 doses for  dizziness. Patient not taking: Reported on 05/12/2023 12/06/22   Terald Sleeper, MD    Allergies   Allergies  Allergen Reactions   Crestor [Rosuvastatin] Swelling    TONGUE SWELLS    Review of Systems  ROS  Neurologic Exam  Awake, alert, oriented Memory and concentration grossly intact Speech fluent, appropriate CN grossly intact Motor exam: Upper Extremities Deltoid Bicep Tricep Grip  Right 5/5 5/5 5/5 5/5  Left 5/5 5/5 5/5 5/5   Lower Extremities IP Quad PF DF EHL  Right 5/5 5/5 5/5 5/5 5/5  Left 5/5 5/5 5/5 5/5 5/5   Sensation grossly intact to LT  Imaging  CT reveals large mixed density left convexity chronic SDH with local mass effect and rightward midline shift.  Impression  - 77 y.o. male with largely asymtpomtaic left convexity chronic SDH.   Plan  - Will proceed with left MMA embolization - Plan on observation overnight with likely d/c home tomorrow if stable.  I have reviewed the indications for the procedure as well as the details of the procedure and the expected postoperative course and recovery at length with the patient and his wife in the office. We have also reviewed in detail the risks, benefits, and alternatives to the procedure. All questions were answered and Frederick Watkins provided informed consent to proceed.  Lisbeth Renshaw, MD [facility]

## 2023-05-12 NOTE — Progress Notes (Signed)
Dr. Conchita Paris made aware that patient last took Keppra and Methylprednisolone yesterday. Verbal order received from Dr. Conchita Paris to give Keppra and Methylprednisolone in pre-op.

## 2023-05-13 ENCOUNTER — Encounter (HOSPITAL_COMMUNITY): Payer: Self-pay | Admitting: Neurosurgery

## 2023-05-13 NOTE — Plan of Care (Signed)
  Problem: Education: Goal: Knowledge of General Education information will improve Description: Including pain rating scale, medication(s)/side effects and non-pharmacologic comfort measures Outcome: Progressing   Problem: Clinical Measurements: Goal: Ability to maintain clinical measurements within normal limits will improve Outcome: Progressing   Problem: Coping: Goal: Level of anxiety will decrease Outcome: Progressing   Problem: Safety: Goal: Ability to remain free from injury will improve Outcome: Progressing   Problem: Skin Integrity: Goal: Risk for impaired skin integrity will decrease Outcome: Progressing

## 2023-05-13 NOTE — Plan of Care (Signed)
Patient discharged home with wife. AVS printed and reviewed with wife and patient. Bil. hearing aids on patient. Volunteer services transported to main entrance.    Problem: Education: Goal: Knowledge of General Education information will improve Description: Including pain rating scale, medication(s)/side effects and non-pharmacologic comfort measures Outcome: Completed/Met   Problem: Health Behavior/Discharge Planning: Goal: Ability to manage health-related needs will improve Outcome: Completed/Met   Problem: Clinical Measurements: Goal: Ability to maintain clinical measurements within normal limits will improve Outcome: Completed/Met Goal: Will remain free from infection Outcome: Completed/Met Goal: Diagnostic test results will improve Outcome: Completed/Met Goal: Respiratory complications will improve Outcome: Completed/Met Goal: Cardiovascular complication will be avoided Outcome: Completed/Met   Problem: Activity: Goal: Risk for activity intolerance will decrease Outcome: Completed/Met   Problem: Nutrition: Goal: Adequate nutrition will be maintained Outcome: Completed/Met   Problem: Coping: Goal: Level of anxiety will decrease Outcome: Completed/Met   Problem: Elimination: Goal: Will not experience complications related to bowel motility Outcome: Completed/Met Goal: Will not experience complications related to urinary retention Outcome: Completed/Met   Problem: Pain Management: Goal: General experience of comfort will improve Outcome: Completed/Met   Problem: Safety: Goal: Ability to remain free from injury will improve Outcome: Completed/Met   Problem: Skin Integrity: Goal: Risk for impaired skin integrity will decrease Outcome: Completed/Met   Problem: Education: Goal: Knowledge of the prescribed therapeutic regimen will improve Outcome: Completed/Met   Problem: Clinical Measurements: Goal: Usual level of consciousness will be regained or  maintained. Outcome: Completed/Met Goal: Neurologic status will improve Outcome: Completed/Met Goal: Ability to maintain intracranial pressure will improve Outcome: Completed/Met   Problem: Skin Integrity: Goal: Demonstration of wound healing without infection will improve Outcome: Completed/Met

## 2023-05-13 NOTE — TOC Transition Note (Signed)
Transition of Care [facility]) - CM/SW Discharge Note   Patient Details  Name: Frederick Watkins MRN: [id number] Date of Birth: April 27, 1946  Transition of Care [facility]) CM/SW Contact:  Kermit Balo, RN Phone Number: 05/13/2023, 10:42 AM   Clinical Narrative:     Pt is discharging home today. No needs per TOC.  Final next level of care: Home/Self Care Barriers to Discharge: No Barriers Identified   Patient Goals and CMS Choice      Discharge Placement                         Discharge Plan and Services Additional resources added to the After Visit Summary for                                       Social Determinants of Health (SDOH) Interventions SDOH Screenings   Food Insecurity: No Food Insecurity (05/12/2023)  Housing: Low Risk  (05/12/2023)  Transportation Needs: No Transportation Needs (05/13/2023)  Utilities: Not At Risk (05/13/2023)  Tobacco Use: Low Risk  (05/12/2023)     Readmission Risk Interventions     No data to display

## 2023-05-13 NOTE — Discharge Summary (Signed)
Physician Discharge Summary  Patient ID: Frederick Watkins MRN: [id number] DOB/AGE: May 17, 1946 77 y.o.  Admit date: 05/12/2023 Discharge date: 05/13/2023  Admission Diagnoses:  Chronic SDH  Discharge Diagnoses:  Same Principal Problem:   SDH (subdural hematoma) (HCC) Active Problems:   Chronic subdural hematoma [facility])   Discharged Condition: Stable  Hospital Course:  [name] is a 77 y.o. male admitted after elective left MMA embolization for cSDH. He was at baseline postop with minimal HA. He requested d/c home.  Treatments: Surgery - left MMA embolization  Discharge Exam: Blood pressure 114/72, pulse 81, temperature 98 F (36.7 C), temperature source Oral, resp. rate 18, height 6' (1.829 m), weight 92.5 kg, SpO2 93%. Awake, alert, oriented Speech fluent, appropriate CN grossly intact 5/5 BUE/BLE Right groin soft  Disposition: Discharge disposition: 01-Home or Self Care       Discharge Instructions     Call MD for:  redness, tenderness, or signs of infection (pain, swelling, redness, odor or green/yellow discharge around incision site)   Complete by: As directed    Call MD for:  temperature >100.4   Complete by: As directed    Diet - low sodium heart healthy   Complete by: As directed    Discharge instructions   Complete by: As directed    Walk at home as much as possible, at least 4 times / day   Increase activity slowly   Complete by: As directed    Lifting restrictions   Complete by: As directed    No lifting > 10 lbs   May shower / Bathe   Complete by: As directed    48 hours after surgery   May walk up steps   Complete by: As directed    No wound care   Complete by: As directed    Other Restrictions   Complete by: As directed    No bending/twisting at waist      Allergies as of 05/13/2023       Reactions   Crestor [rosuvastatin] Swelling   TONGUE SWELLS        Medication List     STOP taking these medications    aspirin EC  81 MG tablet   meclizine 25 MG tablet Commonly known as: ANTIVERT       TAKE these medications    atorvastatin 40 MG tablet Commonly known as: LIPITOR Take 40 mg by mouth daily.   ezetimibe 10 MG tablet Commonly known as: ZETIA Take 1 tablet (10 mg total) by mouth daily.   gabapentin 100 MG capsule Commonly known as: Neurontin Take 2 capsules every night What changed:  how much to take how to take this when to take this additional instructions   levETIRAcetam 500 MG tablet Commonly known as: KEPPRA Take 500 mg by mouth 2 (two) times daily.   lisinopril 20 MG tablet Commonly known as: ZESTRIL Take 20 mg by mouth every evening.   LORazepam 1 MG tablet Commonly known as: ATIVAN Take 1 mg by mouth as needed for anxiety.   methylPREDNISolone 4 MG Tbpk tablet Commonly known as: MEDROL DOSEPAK Take by mouth See admin instructions. Take per taper instructions   pantoprazole 40 MG tablet Commonly known as: PROTONIX Take 40 mg by mouth 2 (two) times daily.   sertraline 100 MG tablet Commonly known as: ZOLOFT Take 200 mg by mouth daily.   traZODone 50 MG tablet Commonly known as: DESYREL Take 50 mg by mouth at bedtime as needed for sleep.  vitamin C 1000 MG tablet Take 1,000 mg by mouth daily.        Follow-up Information     Julio Sicks, MD Follow up in 3 week(s).   Specialty: Neurosurgery Contact information: [address] [place] [address] [phone]                 Signed: Jackelyn Hoehn 05/13/2023, 10:16 AM

## 2023-05-25 DIAGNOSIS — Z23 Encounter for immunization: Secondary | ICD-10-CM | POA: Diagnosis not present

## 2023-05-25 DIAGNOSIS — I6203 Nontraumatic chronic subdural hemorrhage: Secondary | ICD-10-CM | POA: Diagnosis not present

## 2023-05-28 ENCOUNTER — Other Ambulatory Visit: Payer: Self-pay | Admitting: Neurosurgery

## 2023-05-28 DIAGNOSIS — I6203 Nontraumatic chronic subdural hemorrhage: Secondary | ICD-10-CM

## 2023-05-29 ENCOUNTER — Ambulatory Visit
Admission: RE | Admit: 2023-05-29 | Discharge: 2023-05-29 | Disposition: A | Discharge status: Home / Self Care | Payer: Medicare Other | Source: Ambulatory Visit | Attending: Neurosurgery | Admitting: Neurosurgery

## 2023-05-29 DIAGNOSIS — I6200 Nontraumatic subdural hemorrhage, unspecified: Secondary | ICD-10-CM | POA: Diagnosis not present

## 2023-05-29 DIAGNOSIS — I6203 Nontraumatic chronic subdural hemorrhage: Secondary | ICD-10-CM

## 2023-06-10 ENCOUNTER — Other Ambulatory Visit: Payer: Self-pay | Admitting: Neurosurgery

## 2023-06-10 DIAGNOSIS — I6203 Nontraumatic chronic subdural hemorrhage: Secondary | ICD-10-CM

## 2023-07-14 ENCOUNTER — Ambulatory Visit
Admission: RE | Admit: 2023-07-14 | Discharge: 2023-07-14 | Disposition: A | Discharge status: Home / Self Care | Payer: Medicare Other | Source: Ambulatory Visit | Attending: Neurosurgery | Admitting: Neurosurgery

## 2023-07-14 DIAGNOSIS — I6203 Nontraumatic chronic subdural hemorrhage: Secondary | ICD-10-CM | POA: Diagnosis not present

## 2023-07-16 DIAGNOSIS — I6203 Nontraumatic chronic subdural hemorrhage: Secondary | ICD-10-CM | POA: Diagnosis not present

## 2023-09-09 ENCOUNTER — Other Ambulatory Visit: Payer: Self-pay | Admitting: Neurosurgery

## 2023-09-09 DIAGNOSIS — I6203 Nontraumatic chronic subdural hemorrhage: Secondary | ICD-10-CM

## 2023-10-08 ENCOUNTER — Other Ambulatory Visit: Payer: Self-pay | Admitting: Cardiology

## 2023-10-15 DIAGNOSIS — G629 Polyneuropathy, unspecified: Secondary | ICD-10-CM | POA: Diagnosis not present

## 2023-10-15 DIAGNOSIS — K219 Gastro-esophageal reflux disease without esophagitis: Secondary | ICD-10-CM | POA: Diagnosis not present

## 2023-10-15 DIAGNOSIS — Z0000 Encounter for general adult medical examination without abnormal findings: Secondary | ICD-10-CM | POA: Diagnosis not present

## 2023-10-15 DIAGNOSIS — E7800 Pure hypercholesterolemia, unspecified: Secondary | ICD-10-CM | POA: Diagnosis not present

## 2023-10-15 DIAGNOSIS — G4700 Insomnia, unspecified: Secondary | ICD-10-CM | POA: Diagnosis not present

## 2023-10-15 DIAGNOSIS — I10 Essential (primary) hypertension: Secondary | ICD-10-CM | POA: Diagnosis not present

## 2023-10-15 DIAGNOSIS — G3184 Mild cognitive impairment, so stated: Secondary | ICD-10-CM | POA: Diagnosis not present

## 2023-10-27 ENCOUNTER — Other Ambulatory Visit: Payer: Self-pay | Admitting: Cardiology

## 2023-10-29 DIAGNOSIS — M1711 Unilateral primary osteoarthritis, right knee: Secondary | ICD-10-CM | POA: Diagnosis not present

## 2023-10-30 DIAGNOSIS — M1711 Unilateral primary osteoarthritis, right knee: Secondary | ICD-10-CM | POA: Diagnosis not present

## 2023-11-03 ENCOUNTER — Other Ambulatory Visit: Payer: Self-pay | Admitting: Cardiology

## 2023-11-17 ENCOUNTER — Ambulatory Visit: Payer: Self-pay | Admitting: Mental Health

## 2023-11-17 DIAGNOSIS — F4323 Adjustment disorder with mixed anxiety and depressed mood: Secondary | ICD-10-CM

## 2023-11-17 NOTE — Progress Notes (Signed)
 [facility] Initial Adult Exam  Name: Frederick Watkins Date: 11/17/2023 MRN: [id number] DOB: [birth year] PCP: Roselind Congo, MD  Time spent: 50 minutes  Reason for Visit /Presenting Problem: patient reports he and his wife have been married x 58 years. They have 2 adult sons. They took care of their granddaughter growing up, she is now getting married this September. He wants to work on his anger mgmt, states he gets upset, says hurtful comments, then walks away. He loves his wife and wants to change. She copes with ulstrative collitis. He feels he causes her stress, which affects her health. He stated he has struggled for years, [place] but he knows this hurt her. He has tried over the past 3 weeks to not walk or go away for a few days, which he has done in the past.  He reports that his wife primarily has gotten upset with him over looking at other women, he states this is not always accurate, however he does not want to make her feel this way and stated that about a month ago, he became more motivated to make a change with himself, focusing more on how he is making her feel as opposed to focusing more on his own needs.  They had 3 sons, some challenges with these relationships and some of their wives.  He stated they went to therapy, he and his wife about 7 years ago due to grandparent alienation as they had little to no contact with their grandchildren.  Although he would like these relationships to change, he and his wife have been hurt by feeling alienated and he tries to be supportive of his wife as she continues to experience sadness associated. Recommended he return in 2 weeks for continued therapy.     Mental Status Exam:    Appearance:    Casual     Behavior:   Appropriate  Motor:   WNL  Speech/Language:    Clear and Coherent  Affect:   Full range   Mood:   Pleasant, some sadness  Thought process:   Logical, linear, goal directed  Thought  content:     WNL  Sensory/Perceptual disturbances:     none  Orientation:   x4  Attention:   Good  Concentration:   Good  Memory:   Intact  Fund of knowledge:    Consistent with age and development  Insight:     Good  Judgment:    Good  Impulse Control:   Good     Reported Symptoms: Irritability, challenges managing anger, avoidant behavior when upset, sadness and anxiety related to his marital relationship   Risk Assessment: Danger to Self:  No Self-injurious Behavior: No Danger to Others: No Duty to Warn:no Physical Aggression / Violence:No  Access to Firearms a concern: No  Gang Involvement:No  Patient / guardian was educated about steps to take if suicide or homicide risk level increases between visits: yes While future psychiatric events cannot be accurately predicted, the patient does not currently require acute inpatient psychiatric care and does not currently meet [place]  involuntary commitment criteria.   Substance Abuse History: Current substance abuse: No     Past Psychiatric History:   Outpatient Providers:  History of Psych Hospitalization: No  Psychological Testing: none   Medical History/Surgical History: Past Medical History:  Diagnosis Date   Coronary artery calcification 12/06/2018   Coronary artery disease involving native coronary artery of native heart  without angina pectoris 12/06/2018   Essential hypertension 12/06/2018   GERD (gastroesophageal reflux disease)    H/O hernia repair    Headache    History of cholecystectomy    Lacunar infarct    Left basal ganglia   Mixed hyperlipidemia 12/06/2018   Mucous cyst of finger    Left thumb   Right bundle branch block 12/06/2018   RLS (restless legs syndrome)     Past Surgical History:  Procedure Laterality Date   CHOLECYSTECTOMY  1999   EAR CYST EXCISION  05/31/2012   Procedure: CYST REMOVAL;  Surgeon: Sheryl Donna, MD;  Location: [facility];  Service: Orthopedics;   Laterality: Left;  Left Thumb Mucous Cyst Excision   HERNIA REPAIR  1994   IR ANGIO EXTERNAL CAROTID SEL EXT CAROTID UNI L MOD SED  05/12/2023   IR ANGIO INTRA EXTRACRAN SEL INTERNAL CAROTID UNI L MOD SED  05/12/2023   IR ANGIOGRAM FOLLOW UP STUDY  05/12/2023   IR NEURO EACH ADD'L AFTER BASIC UNI LEFT (MS)  05/12/2023   IR TRANSCATH/EMBOLIZ  05/12/2023   RADIOLOGY WITH ANESTHESIA N/A 05/12/2023   Procedure: IR WITH ANESTHESIA;  Surgeon: Augusto Blonder, MD;  Location: [facility] OR;  Service: Radiology;  Laterality: N/A;   RETINAL DETACHMENT REPAIR W/ SCLERAL BUCKLE LE  03/07/2012   W/ CATARACT EXTRACTION WITH LENS IMPLANT , RIGHT EYE    Medications: Current Outpatient Medications  Medication Sig Dispense Refill   Ascorbic Acid  (VITAMIN C ) 1000 MG tablet Take 1,000 mg by mouth daily.     atorvastatin  (LIPITOR) 40 MG tablet Take 40 mg by mouth daily.     ezetimibe  (ZETIA ) 10 MG tablet Take 1 tablet (10 mg total) by mouth daily. Please call office to schedule an appt for further refills. Thank you 15 tablet 0   gabapentin  (NEURONTIN ) 100 MG capsule Take 2 capsules every night (Patient taking differently: Take 100 mg by mouth 2 (two) times daily.) 180 capsule 3   levETIRAcetam  (KEPPRA ) 500 MG tablet Take 500 mg by mouth 2 (two) times daily.     lisinopril  (PRINIVIL ,ZESTRIL ) 20 MG tablet Take 20 mg by mouth every evening.     LORazepam  (ATIVAN ) 1 MG tablet Take 1 mg by mouth as needed for anxiety.     methylPREDNISolone  (MEDROL  DOSEPAK) 4 MG TBPK tablet Take by mouth See admin instructions. Take per taper instructions     pantoprazole  (PROTONIX ) 40 MG tablet Take 40 mg by mouth 2 (two) times daily.     sertraline  (ZOLOFT ) 100 MG tablet Take 200 mg by mouth daily.     traZODone  (DESYREL ) 50 MG tablet Take 50 mg by mouth at bedtime as needed for sleep.     No current facility-administered medications for this visit.    Allergies  Allergen Reactions   Crestor [Rosuvastatin] Swelling    TONGUE SWELLS     Diagnoses:  No diagnosis found.  Plan of Care: To be determined   Avram Lenis, [facility]

## 2023-11-18 ENCOUNTER — Institutional Professional Consult (permissible substitution) (INDEPENDENT_AMBULATORY_CARE_PROVIDER_SITE_OTHER)

## 2023-12-07 ENCOUNTER — Ambulatory Visit
Admission: RE | Admit: 2023-12-07 | Discharge: 2023-12-07 | Disposition: A | Discharge status: Home / Self Care | Source: Ambulatory Visit | Attending: Neurosurgery | Admitting: Neurosurgery

## 2023-12-07 DIAGNOSIS — I6203 Nontraumatic chronic subdural hemorrhage: Secondary | ICD-10-CM

## 2023-12-10 DIAGNOSIS — I6203 Nontraumatic chronic subdural hemorrhage: Secondary | ICD-10-CM | POA: Diagnosis not present

## 2023-12-16 ENCOUNTER — Ambulatory Visit: Admitting: Mental Health

## 2023-12-16 DIAGNOSIS — F4323 Adjustment disorder with mixed anxiety and depressed mood: Secondary | ICD-10-CM | POA: Diagnosis not present
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
## 2023-12-30 ENCOUNTER — Ambulatory Visit: Admitting: Mental Health

## 2023-12-30 DIAGNOSIS — F4323 Adjustment disorder with mixed anxiety and depressed mood: Secondary | ICD-10-CM

## 2023-12-30 NOTE — Progress Notes (Signed)
 [facility] Initial Adult Exam  Name: Frederick Watkins Date: 12/30/2023 MRN: [id number] DOB: [birth year] PCP: Arloa Elsie SAUNDERS, MD  Time spent: 50 minutes Time in:  1:00pm   time out:  1:50pm  Treatment:  ind. therapy    Mental Status Exam:    Appearance:    Casual     Behavior:   Appropriate  Motor:   WNL  Speech/Language:    Clear and Coherent  Affect:   Full range   Mood:   Pleasant, some sadness  Thought process:   Logical, linear, goal directed  Thought content:     WNL  Sensory/Perceptual disturbances:     none  Orientation:   x4  Attention:   Good  Concentration:   Good  Memory:   Intact  Fund of knowledge:    Consistent with age and development  Insight:     Good  Judgment:    Good  Impulse Control:   Good     Reported Symptoms: Irritability, challenges managing anger, avoidant behavior when upset, sadness and anxiety related to his marital relationship   Risk Assessment: Danger to Self:  No Self-injurious Behavior: No Danger to Others: No Duty to Warn:no Physical Aggression / Violence:No  Access to Firearms a concern: No  Gang Involvement:No  Patient / guardian was educated about steps to take if suicide or homicide risk level increases between visits: yes While future psychiatric events cannot be accurately predicted, the patient does not currently require acute inpatient psychiatric care and does not currently meet [place]  involuntary commitment criteria.     Medications: Current Outpatient Medications  Medication Sig Dispense Refill   Ascorbic Acid  (VITAMIN C ) 1000 MG tablet Take 1,000 mg by mouth daily.     atorvastatin  (LIPITOR) 40 MG tablet Take 40 mg by mouth daily.     ezetimibe  (ZETIA ) 10 MG tablet Take 1 tablet (10 mg total) by mouth daily. Please call office to schedule an appt for further refills. Thank you 15 tablet 0   gabapentin  (NEURONTIN ) 100 MG capsule Take 2 capsules every night (Patient taking differently: Take 100 mg  by mouth 2 (two) times daily.) 180 capsule 3   levETIRAcetam  (KEPPRA ) 500 MG tablet Take 500 mg by mouth 2 (two) times daily.     lisinopril  (PRINIVIL ,ZESTRIL ) 20 MG tablet Take 20 mg by mouth every evening.     LORazepam  (ATIVAN ) 1 MG tablet Take 1 mg by mouth as needed for anxiety.     methylPREDNISolone  (MEDROL  DOSEPAK) 4 MG TBPK tablet Take by mouth See admin instructions. Take per taper instructions     pantoprazole  (PROTONIX ) 40 MG tablet Take 40 mg by mouth 2 (two) times daily.     sertraline  (ZOLOFT ) 100 MG tablet Take 200 mg by mouth daily.     traZODone  (DESYREL ) 50 MG tablet Take 50 mg by mouth at bedtime as needed for sleep.     No current facility-administered medications for this visit.    Subjective:  Patient arrived on time for today's session.  Patient shared progress, sharing his positive interactions that both he and his wife have with their adult granddaughter.  He stated that they have contact with one of their granddaughters, sharing how they no contact with the other.  He stated this is also consistent with how his sons do not communicate with them often, at this point it being more of a rare occasion.  Provide support and understanding as he process feelings of sadness and frustration.  Went on to share how he is continue to try to make efforts and being more supportive emotionally with his wife.  He stated that example where he tried to follow through with adhering to how she likes the bed made.  He stated that he told her his efforts and she then got upset with him for mentioning it.  He further expressed confusion about her reaction as he stated he was genuinely trying to be more understanding and accommodating to her needs.  Explored more details regarding their communication, further explored ways he feels comfortable and trying to work on his communication in a relationship, if he feels is causing some distress.   Interventions: Further assessment, CBT, supportive  therapy   Diagnoses:    ICD-10-CM   1. Adjustment disorder with mixed anxiety and depressed mood  F43.23      Plan: Patient is to use coping skills to help manage decrease symptoms.  Utilize his support system.  Long-term goal: Reduce stress, anxiety and sad feelings related to his marital relationship.  Short-term goal: Identify and process feelings related to challenging events in his marriage that contribute to his distressful feelings.                   Identify effective ways to communicate in his marital relationship where his wife feels understood and supported.   Assessment of progress:  progressing    Lonni Fischer, [facility]

## 2024-01-14 ENCOUNTER — Ambulatory Visit: Admitting: Mental Health

## 2024-01-14 DIAGNOSIS — F4323 Adjustment disorder with mixed anxiety and depressed mood: Secondary | ICD-10-CM

## 2024-01-14 NOTE — Progress Notes (Signed)
 [facility] Initial Adult Exam  Name: Frederick Watkins Date: 01/14/2024 MRN: [id number] DOB: 01/04/1946 PCP: Arloa Elsie SAUNDERS, MD  Time spent: 54 minutes Time in:  1:00pm   time out:  1:54pm  Treatment:  ind. therapy    Mental Status Exam:    Appearance:    Casual     Behavior:   Appropriate  Motor:   WNL  Speech/Language:    Clear and Coherent  Affect:   Full range   Mood:   Pleasant, some sadness  Thought process:   Logical, linear, goal directed  Thought content:     WNL  Sensory/Perceptual disturbances:     none  Orientation:   x4  Attention:   Good  Concentration:   Good  Memory:   Intact  Fund of knowledge:    Consistent with age and development  Insight:     Good  Judgment:    Good  Impulse Control:   Good     Reported Symptoms: Irritability, challenges managing anger, avoidant behavior when upset, sadness and anxiety related to his marital relationship   Risk Assessment: Danger to Self:  No Self-injurious Behavior: No Danger to Others: No Duty to Warn:no Physical Aggression / Violence:No  Access to Firearms a concern: No  Gang Involvement:No  Patient / guardian was educated about steps to take if suicide or homicide risk level increases between visits: yes While future psychiatric events cannot be accurately predicted, the patient does not currently require acute inpatient psychiatric care and does not currently meet [place]  involuntary commitment criteria.     Medications: Current Outpatient Medications  Medication Sig Dispense Refill   Ascorbic Acid  (VITAMIN C ) 1000 MG tablet Take 1,000 mg by mouth daily.     atorvastatin  (LIPITOR) 40 MG tablet Take 40 mg by mouth daily.     ezetimibe  (ZETIA ) 10 MG tablet Take 1 tablet (10 mg total) by mouth daily. Please call office to schedule an appt for further refills. Thank you 15 tablet 0   gabapentin  (NEURONTIN ) 100 MG capsule Take 2 capsules every night (Patient taking differently: Take 100 mg  by mouth 2 (two) times daily.) 180 capsule 3   levETIRAcetam  (KEPPRA ) 500 MG tablet Take 500 mg by mouth 2 (two) times daily.     lisinopril  (PRINIVIL ,ZESTRIL ) 20 MG tablet Take 20 mg by mouth every evening.     LORazepam  (ATIVAN ) 1 MG tablet Take 1 mg by mouth as needed for anxiety.     methylPREDNISolone  (MEDROL  DOSEPAK) 4 MG TBPK tablet Take by mouth See admin instructions. Take per taper instructions     pantoprazole  (PROTONIX ) 40 MG tablet Take 40 mg by mouth 2 (two) times daily.     sertraline  (ZOLOFT ) 100 MG tablet Take 200 mg by mouth daily.     traZODone  (DESYREL ) 50 MG tablet Take 50 mg by mouth at bedtime as needed for sleep.     No current facility-administered medications for this visit.    Subjective:  Patient arrived on time for today's session.  Patient shared recent events, how he and his wife got into an argument about 2 weeks ago.  He stated that it was over his not bringing lunch home for her when he was out.  He stated that he was unaware that she had wanted lunch, that this is not typical behavior as he has not done this before.  Confused, he went on to try to express himself, it turning into an argument.  He stated that  he eventually sat out in the driveway in his car.  He stated that he went out there to be alone but did not plan on leaving.  He stated his wife was upset, thinks he is going to leave her.  He stated he has no intention of leaving her, wants the relationship to continue.  Ways to communicate in the relationship, to express himself effectively was explored.  Ways to communicate outside of during arguments was discussed specifically as patient shared he has had difficult time expressing himself and his needs and the relationship in this way.  He plans to follow through between sessions.   Interventions: Further assessment, CBT, supportive therapy   Diagnoses:    ICD-10-CM   1. Adjustment disorder with mixed anxiety and depressed mood  F43.23       Plan:  Patient is to use coping skills to help manage decrease symptoms.  Utilize his support system.  Long-term goal: Reduce stress, anxiety and sad feelings related to his marital relationship.  Short-term goal: Identify and process feelings related to challenging events in his marriage that contribute to his distressful feelings.                   Identify effective ways to communicate in his marital relationship where his wife feels understood and supported.   Assessment of progress:  progressing    Lonni Fischer, [facility]

## 2024-01-28 ENCOUNTER — Ambulatory Visit: Admitting: Mental Health

## 2024-01-28 DIAGNOSIS — F4323 Adjustment disorder with mixed anxiety and depressed mood: Secondary | ICD-10-CM

## 2024-01-28 NOTE — Progress Notes (Signed)
 [facility] psychotherapy note  Name: Frederick Watkins Date: 01/28/2024 MRN: [id number] DOB: [birth year] PCP: Arloa Elsie SAUNDERS, MD  Time spent: 51 minutes Time in:  1:00pm   time out:  1:51pm  Treatment:  ind. therapy    Mental Status Exam:    Appearance:    Casual     Behavior:   Appropriate  Motor:   WNL  Speech/Language:    Clear and Coherent  Affect:   Full range   Mood:   Pleasant, some sadness  Thought process:   Logical, linear, goal directed  Thought content:     WNL  Sensory/Perceptual disturbances:     none  Orientation:   x4  Attention:   Good  Concentration:   Good  Memory:   Intact  Fund of knowledge:    Consistent with age and development  Insight:     Good  Judgment:    Good  Impulse Control:   Good     Reported Symptoms: Irritability, challenges managing anger, avoidant behavior when upset, sadness and anxiety related to his marital relationship   Risk Assessment: Danger to Self:  No Self-injurious Behavior: No Danger to Others: No Duty to Warn:no Physical Aggression / Violence:No  Access to Firearms a concern: No  Gang Involvement:No  Patient / guardian was educated about steps to take if suicide or homicide risk level increases between visits: yes While future psychiatric events cannot be accurately predicted, the patient does not currently require acute inpatient psychiatric care and does not currently meet [place]  involuntary commitment criteria.     Medications: Current Outpatient Medications  Medication Sig Dispense Refill   Ascorbic Acid  (VITAMIN C ) 1000 MG tablet Take 1,000 mg by mouth daily.     atorvastatin  (LIPITOR) 40 MG tablet Take 40 mg by mouth daily.     ezetimibe  (ZETIA ) 10 MG tablet Take 1 tablet (10 mg total) by mouth daily. Please call office to schedule an appt for further refills. Thank you 15 tablet 0   gabapentin  (NEURONTIN ) 100 MG capsule Take 2 capsules every night (Patient taking differently: Take 100 mg  by mouth 2 (two) times daily.) 180 capsule 3   levETIRAcetam  (KEPPRA ) 500 MG tablet Take 500 mg by mouth 2 (two) times daily.     lisinopril  (PRINIVIL ,ZESTRIL ) 20 MG tablet Take 20 mg by mouth every evening.     LORazepam  (ATIVAN ) 1 MG tablet Take 1 mg by mouth as needed for anxiety.     methylPREDNISolone  (MEDROL  DOSEPAK) 4 MG TBPK tablet Take by mouth See admin instructions. Take per taper instructions     pantoprazole  (PROTONIX ) 40 MG tablet Take 40 mg by mouth 2 (two) times daily.     sertraline  (ZOLOFT ) 100 MG tablet Take 200 mg by mouth daily.     traZODone  (DESYREL ) 50 MG tablet Take 50 mg by mouth at bedtime as needed for sleep.     No current facility-administered medications for this visit.    Subjective:  Patient arrived on time for today's session.  Assessed progress.  He shared how he and his wife have had no arguments over the last couple of weeks since his last visit.  He stated they are working on their communication and he feels progress is being made.  He went on to share more family dynamics related to his sons.  He stated that his younger son has no contact with both him or his wife.  He went on to share some history related to incidences  that he feels may have contributed to their relational strain, however, he stated that this son has a history of insulting them, raising his voice and becoming belligerent.  He stated his middle son makes contact with him from time to time.  He valued his middle son writing him a note on his birthday which was last week.  He stated that he will communicate with them on holidays as well.  Efforts he has made to reconnect with his son such as meeting up at [employer], have been unsuccessful.  He stated that he will decline without giving a reason.  Patient suspects it is his wife who is affected him and therefore their relationship.  He stated his oldest son does not communicate with him at all.  Ways that he tries to cope by framing it differently  in his mind differently such as his trying not to think about it.  He stated both he and his wife try to be supportive to one another when it is discussed.  Interventions: Motivational interviewing, CBT, supportive therapy   Diagnoses:    ICD-10-CM   1. Adjustment disorder with mixed anxiety and depressed mood  F43.23        Plan: Patient is to use coping skills to help manage decrease symptoms.  Utilize his support system.  Long-term goal: Reduce stress, anxiety and sad feelings related to his marital relationship.  Short-term goal: Identify and process feelings related to challenging events in his marriage that contribute to his distressful feelings.                   Identify effective ways to communicate in his marital relationship where his wife feels understood and supported.   Assessment of progress:  progressing    Lonni Fischer, [facility]

## 2024-02-10 DIAGNOSIS — D3132 Benign neoplasm of left choroid: Secondary | ICD-10-CM | POA: Diagnosis not present

## 2024-02-10 DIAGNOSIS — H35372 Puckering of macula, left eye: Secondary | ICD-10-CM | POA: Diagnosis not present

## 2024-02-10 DIAGNOSIS — Z961 Presence of intraocular lens: Secondary | ICD-10-CM | POA: Diagnosis not present

## 2024-02-10 DIAGNOSIS — H1045 Other chronic allergic conjunctivitis: Secondary | ICD-10-CM | POA: Diagnosis not present

## 2024-02-10 DIAGNOSIS — H43813 Vitreous degeneration, bilateral: Secondary | ICD-10-CM | POA: Diagnosis not present

## 2024-02-23 DIAGNOSIS — M1711 Unilateral primary osteoarthritis, right knee: Secondary | ICD-10-CM | POA: Diagnosis not present

## 2024-02-24 ENCOUNTER — Ambulatory Visit: Admitting: Mental Health

## 2024-02-24 DIAGNOSIS — F4323 Adjustment disorder with mixed anxiety and depressed mood: Secondary | ICD-10-CM | POA: Diagnosis not present

## 2024-02-24 NOTE — Progress Notes (Signed)
 [facility] psychotherapy note  Name: Frederick Watkins Date: 02/24/24 MRN: [id number] DOB: 1946-07-06 PCP: Frederick Elsie SAUNDERS, MD  Time spent: 50 minutes Time in:  1:00pm   time out:  1:50pm  Treatment:  ind. therapy    Mental Status Exam:    Appearance:    Casual     Behavior:   Appropriate  Motor:   WNL  Speech/Language:    Clear and Coherent  Affect:   Full range   Mood:   Pleasant, some sadness  Thought process:   Logical, linear, goal directed  Thought content:     WNL  Sensory/Perceptual disturbances:     none  Orientation:   x4  Attention:   Good  Concentration:   Good  Memory:   Intact  Fund of knowledge:    Consistent with age and development  Insight:     Good  Judgment:    Good  Impulse Control:   Good     Reported Symptoms: Irritability, challenges managing anger, avoidant behavior when upset, sadness and anxiety related to his marital relationship   Risk Assessment: Danger to Self:  No Self-injurious Behavior: No Danger to Others: No Duty to Warn:no Physical Aggression / Violence:No  Access to Firearms a concern: No  Gang Involvement:No  Patient / guardian was educated about steps to take if suicide or homicide risk level increases between visits: yes While future psychiatric events cannot be accurately predicted, the patient does not currently require acute inpatient psychiatric care and does not currently meet [place]  involuntary commitment criteria.     Medications: Current Outpatient Medications  Medication Sig Dispense Refill   Ascorbic Acid  (VITAMIN C ) 1000 MG tablet Take 1,000 mg by mouth daily.     atorvastatin  (LIPITOR) 40 MG tablet Take 40 mg by mouth daily.     ezetimibe  (ZETIA ) 10 MG tablet Take 1 tablet (10 mg total) by mouth daily. Please call office to schedule an appt for further refills. Thank you 15 tablet 0   gabapentin  (NEURONTIN ) 100 MG capsule Take 2 capsules every night (Patient taking differently: Take 100 mg by  mouth 2 (two) times daily.) 180 capsule 3   levETIRAcetam  (KEPPRA ) 500 MG tablet Take 500 mg by mouth 2 (two) times daily.     lisinopril  (PRINIVIL ,ZESTRIL ) 20 MG tablet Take 20 mg by mouth every evening.     LORazepam  (ATIVAN ) 1 MG tablet Take 1 mg by mouth as needed for anxiety.     methylPREDNISolone  (MEDROL  DOSEPAK) 4 MG TBPK tablet Take by mouth See admin instructions. Take per taper instructions     pantoprazole  (PROTONIX ) 40 MG tablet Take 40 mg by mouth 2 (two) times daily.     sertraline  (ZOLOFT ) 100 MG tablet Take 200 mg by mouth daily.     traZODone  (DESYREL ) 50 MG tablet Take 50 mg by mouth at bedtime as needed for sleep.     No current facility-administered medications for this visit.    Subjective:  Patient arrived on time for today's session. Patient shared progress. We stated that he and his wife continued to work on their relationship, got into a mild argument this morning about him leaving the house to go to his appointments. He stated that she was leaving in the afternoon to see their granddaughter but I told him that he always leaves when she does, always has to be on the go. He stated that she's also referenced the past, while working and the company he would be gone  during the day, her often wanting to know how many appointments and where they were at. He stated that she is commented on his always being gone over those years. Ways to communicate in a relationship was exploring. Utilizing active listening and discussing how this can be an outlet for him to express his emotions as opposed to being reactive as he agreed his anger tends to build over time.    Interventions: Motivational interviewing, CBT, supportive therapy   Diagnoses:    ICD-10-CM   1. Adjustment disorder with mixed anxiety and depressed mood  F43.23         Plan: Patient is to use coping skills to help manage decrease symptoms.  Utilize his support system.  Long-term goal: Reduce stress, anxiety and  sad feelings related to his marital relationship.  Short-term goal: Identify and process feelings related to challenging events in his marriage that contribute to his distressful feelings.                   Identify effective ways to communicate in his marital relationship where his wife feels understood and supported.   Assessment of progress:  progressing    Lonni Fischer, [facility]

## 2024-03-10 ENCOUNTER — Encounter: Payer: Self-pay | Admitting: Cardiology

## 2024-03-10 MED ORDER — EZETIMIBE 10 MG PO TABS: 10.0000 mg | ORAL_TABLET | Freq: Every day | ORAL | 0 refills | Status: DC

## 2024-03-23 ENCOUNTER — Ambulatory Visit (INDEPENDENT_AMBULATORY_CARE_PROVIDER_SITE_OTHER): Admitting: Mental Health

## 2024-03-23 DIAGNOSIS — F4323 Adjustment disorder with mixed anxiety and depressed mood: Secondary | ICD-10-CM | POA: Diagnosis not present

## 2024-03-23 NOTE — Progress Notes (Signed)
 [facility] psychotherapy note  Name: Frederick Watkins Date: 03/23/24 MRN: [id number] DOB: 03/18/1946 PCP: Arloa Elsie SAUNDERS, MD  Time spent: 51 minutes Time in:  1:00pm   time out:  1:51pm  Treatment:  ind. therapy    Mental Status Exam:    Appearance:    Casual     Behavior:   Appropriate  Motor:   WNL  Speech/Language:    Clear and Coherent  Affect:   Full range   Mood:   Pleasant, some sadness  Thought process:   Logical, linear, goal directed  Thought content:     WNL  Sensory/Perceptual disturbances:     none  Orientation:   x4  Attention:   Good  Concentration:   Good  Memory:   Intact  Fund of knowledge:    Consistent with age and development  Insight:     good  Judgment:    Good  Impulse Control:   Good     Reported Symptoms: Irritability, challenges managing anger, avoidant behavior when upset, sadness and anxiety related to his marital relationship   Risk Assessment: Danger to Self:  No Self-injurious Behavior: No Danger to Others: No Duty to Warn:no Physical Aggression / Violence:No  Access to Firearms a concern: No  Gang Involvement:No  Patient / guardian was educated about steps to take if suicide or homicide risk level increases between visits: yes While future psychiatric events cannot be accurately predicted, the patient does not currently require acute inpatient psychiatric care and does not currently meet [place]  involuntary commitment criteria.     Medications: Current Outpatient Medications  Medication Sig Dispense Refill   Ascorbic Acid  (VITAMIN C ) 1000 MG tablet Take 1,000 mg by mouth daily.     atorvastatin  (LIPITOR) 40 MG tablet Take 40 mg by mouth daily.     ezetimibe  (ZETIA ) 10 MG tablet Take 1 tablet (10 mg total) by mouth daily. 90 tablet 0   gabapentin  (NEURONTIN ) 100 MG capsule Take 2 capsules every night (Patient taking differently: Take 100 mg by mouth 2 (two) times daily.) 180 capsule 3   levETIRAcetam  (KEPPRA )  500 MG tablet Take 500 mg by mouth 2 (two) times daily.     lisinopril  (PRINIVIL ,ZESTRIL ) 20 MG tablet Take 20 mg by mouth every evening.     LORazepam  (ATIVAN ) 1 MG tablet Take 1 mg by mouth as needed for anxiety.     methylPREDNISolone  (MEDROL  DOSEPAK) 4 MG TBPK tablet Take by mouth See admin instructions. Take per taper instructions     pantoprazole  (PROTONIX ) 40 MG tablet Take 40 mg by mouth 2 (two) times daily.     sertraline  (ZOLOFT ) 100 MG tablet Take 200 mg by mouth daily.     traZODone  (DESYREL ) 50 MG tablet Take 50 mg by mouth at bedtime as needed for sleep.     No current facility-administered medications for this visit.    Subjective:  Patient arrived on time for today's session.  Assessed progress for patient shared how he and his wife continue to have arguments every few days.  He went on to give details as well as more history.  He stated that he feels like he messed up many years ago as she went to psychiatric inpatient.  He went on to share the details leading up to this inpatient, how she believes had been due to his calling EMS at that time.  Patient was encouraged to recognize based on the details shared, he was trying to ensure her safety.  Collaboratively, we continue to explore their communication, specifically ways he feels that he may need to continue to change his communication as he identified suppressing feelings often, typically to avoid arguments.  We explored the results of this avoidant behavior.  He agreed to follow through with some communication strategies and his relationship specific identifying and sharing his feelings more openly.   Interventions: Motivational interviewing, CBT, supportive therapy   Diagnoses:    ICD-10-CM   1. Adjustment disorder with mixed anxiety and depressed mood  F43.23          Plan: Patient is to use coping skills to help manage decrease symptoms.  Utilize his support system.  Long-term goal: Reduce stress, anxiety and sad  feelings related to his marital relationship.  Short-term goal: Identify and process feelings related to challenging events in his marriage that contribute to his distressful feelings.                   Identify effective ways to communicate in his marital relationship where his wife feels understood and supported.   Assessment of progress:  progressing    Lonni Fischer, [facility]

## 2024-04-07 ENCOUNTER — Ambulatory Visit: Admitting: Cardiology

## 2024-04-11 ENCOUNTER — Encounter: Payer: Self-pay | Admitting: Cardiology

## 2024-04-11 ENCOUNTER — Ambulatory Visit: Attending: Cardiology | Admitting: Cardiology

## 2024-04-11 VITALS — BP 100/57 | HR 74 | Ht 72.0 in | Wt 209.4 lb

## 2024-04-11 DIAGNOSIS — E782 Mixed hyperlipidemia: Secondary | ICD-10-CM

## 2024-04-11 DIAGNOSIS — I4510 Unspecified right bundle-branch block: Secondary | ICD-10-CM

## 2024-04-11 DIAGNOSIS — I2510 Atherosclerotic heart disease of native coronary artery without angina pectoris: Secondary | ICD-10-CM

## 2024-04-11 DIAGNOSIS — I10 Essential (primary) hypertension: Secondary | ICD-10-CM

## 2024-04-11 NOTE — Progress Notes (Signed)
 Cardiology Office Note:  .   Date:  04/11/2024  ID:  Frederick Watkins, DOB July 27, 1945, MRN [id number] PCP: Arloa Elsie SAUNDERS, MD  [facility] Providers Cardiologist:  Oneil Parchment, MD    History of Present Illness: .   Frederick Watkins is a 78 y.o. male Discussed the use of AI scribe software History of Present Illness Frederick Watkins is an 78 year old male with coronary artery disease who presents for follow-up. Former patient of [employer] development officer.  He experiences occasional twinges of chest discomfort. No chest pain or shortness of breath at rest, but he notes shortness of breath with overexertion. He has a history of coronary artery calcification with a negative nuclear stress test many years ago. He is currently taking aspirin 81 mg, atorvastatin  40 mg, and Zetia  10 mg for hyperlipidemia.  He has a history of right bundle branch block, which has been stable without any evidence of syncope. He is on lisinopril  20 mg for hypertension. His prior creatinine was 1.0, potassium 4.2, and hemoglobin 15.6.  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 A CT of the head on January 01, 2024, showed an interval decrease in the size of a chronic left cerebral convexity subdural hematoma with interval resolution of mass effect and no midline shift. He has been followed by neurosurgery for this condition.  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 He is a former Surveyor, minerals at [employer], and his granddaughter, who is also in the [place]  [employer] band, recently got married. He mentions gaining belly fat over the past 12 months and attributes it to sitting too much.  Walked his daughter down the aisle.  No chest pain fevers chills nausea vomiting syncope bleeding       Studies Reviewed: SABRA   EKG Interpretation Date/Time:  Monday April 11 2024 09:05:49 EDT Ventricular Rate:  74 PR Interval:  162 QRS Duration:  136 QT Interval:  418 QTC  Calculation: 463 R Axis:   89  Text Interpretation: Normal sinus rhythm Right bundle branch block When compared with ECG of 06-Dec-2022 09:42, No significant change since last tracing Confirmed by Parchment Oneil ([id number]) on 04/11/2024 9:09:34 AM    Results LABS Creatinine: 1.0 Potassium: 4.2 Hemoglobin: 15.6 LDL: 53 (10/15/2023) ALT: 24  RADIOLOGY CT of the head: Interval decrease in size of chronic left cerebral convexity subdural hematoma with interval resolution of mass effect, no midline shift (01/01/2024)  DIAGNOSTIC EKG: Stable, no changes Risk Assessment/Calculations:            Physical Exam:   VS:  BP (!) 100/57   Pulse 74   Ht 6' (1.829 m)   Wt 209 lb 6.4 oz (95 kg)   SpO2 95%   BMI 28.40 kg/m    Wt Readings from Last 3 Encounters:  04/11/24 209 lb 6.4 oz (95 kg)  05/12/23 204 lb (92.5 kg)  05/04/23 208 lb (94.3 kg)    GEN: Well nourished, well developed in no acute distress NECK: No JVD; No carotid bruits CARDIAC: RRR, no murmurs, no rubs, no gallops RESPIRATORY:  Clear to auscultation without rales, wheezing or rhonchi  ABDOMEN: Soft, non-tender, non-distended EXTREMITIES:  No edema; No deformity   ASSESSMENT AND PLAN: .    Assessment and Plan Assessment & Plan Coronary artery disease without angina Coronary artery disease is well-managed without angina. Occasional chest discomfort is likely musculoskeletal. No dyspnea at rest, only with overexertion. EKG is unchanged. Previous  coronary artery calcification with a negative nuclear stress test many years ago. LDL at 53 as of October 15, 2023. - Continue atorvastatin  40 mg oral daily - Continue ezetimibe  10 mg oral daily - Continue aspirin 81 mg oral daily - Encourage regular physical activity and dietary modifications to manage weight and cardiovascular health  Mixed hyperlipidemia Mixed hyperlipidemia is managed with atorvastatin  and ezetimibe . LDL is well-controlled at 53 as of October 15, 2023. - Continue  atorvastatin  40 mg oral daily - Continue ezetimibe  10 mg oral daily  Right bundle branch block Right bundle branch block is stable without evidence of syncope. EKG shows no changes.  Essential hypertension Hypertension is managed with lisinopril . - Continue lisinopril  20 mg oral every evening         Dispo: 1 yr  Signed, Oneil Parchment, MD

## 2024-04-11 NOTE — Patient Instructions (Signed)
 Medication Instructions:  Your physician recommends that you continue on your current medications as directed. Please refer to the Current Medication list given to you today.  *If you need a refill on your cardiac medications before your next appointment, please call your pharmacy*  Lab Work: NONE ordered at this time of appointment   Testing/Procedures: NONE ordered at this time of appointment   Follow-Up: At [facility], you and your health needs are our priority.  As part of our continuing mission to provide you with exceptional heart care, our providers are all part of one team.  This team includes your primary Cardiologist (physician) and Advanced Practice Providers or APPs (Physician Assistants and Nurse Practitioners) who all work together to provide you with the care you need, when you need it.  Your next appointment:   1 year(s)  Provider:   Oneil Parchment, MD    We recommend signing up for the patient portal called MyChart.  Sign up information is provided on this After Visit Summary.  MyChart is used to connect with patients for Virtual Visits (Telemedicine).  Patients are able to view lab/test results, encounter notes, upcoming appointments, etc.  Non-urgent messages can be sent to your provider as well.   To learn more about what you can do with MyChart, go to [email or web address].

## 2024-04-25 ENCOUNTER — Ambulatory Visit: Admitting: Mental Health

## 2024-04-25 DIAGNOSIS — F4323 Adjustment disorder with mixed anxiety and depressed mood: Secondary | ICD-10-CM | POA: Diagnosis not present

## 2024-04-25 NOTE — Progress Notes (Signed)
 [facility] psychotherapy note  Name: Frederick Watkins Date: 04/25/24 MRN: [id number] DOB: Mar 21, 1946 PCP: Arloa Elsie SAUNDERS, MD  Time spent: 50 minutes Time in:  1:00pm   time out:  1:50pm  Treatment:  ind. therapy    Mental Status Exam:    Appearance:    Casual     Behavior:   Appropriate  Motor:   WNL  Speech/Language:    Clear and Coherent  Affect:   Full range   Mood:   Pleasant, some sadness  Thought process:   Logical, linear, goal directed  Thought content:     WNL  Sensory/Perceptual disturbances:     none  Orientation:   x4  Attention:   Good  Concentration:   Good  Memory:   Intact  Fund of knowledge:    Consistent with age and development  Insight:     good  Judgment:    Good  Impulse Control:   Good     Reported Symptoms: Irritability, challenges managing anger, avoidant behavior when upset, sadness and anxiety related to his marital relationship   Risk Assessment: Danger to Self:  No Self-injurious Behavior: No Danger to Others: No Duty to Warn:no Physical Aggression / Violence:No  Access to Firearms a concern: No  Gang Involvement:No  Patient / guardian was educated about steps to take if suicide or homicide risk level increases between visits: yes While future psychiatric events cannot be accurately predicted, the patient does not currently require acute inpatient psychiatric care and does not currently meet [facility]  involuntary commitment criteria.     Medications: Current Outpatient Medications  Medication Sig Dispense Refill   Ascorbic Acid  (VITAMIN C ) 1000 MG tablet Take 1,000 mg by mouth daily.     atorvastatin  (LIPITOR) 40 MG tablet Take 40 mg by mouth daily.     ezetimibe  (ZETIA ) 10 MG tablet Take 1 tablet (10 mg total) by mouth daily. 90 tablet 0   gabapentin  (NEURONTIN ) 100 MG capsule Take 2 capsules every night (Patient taking differently: Take 100 mg by mouth 2 (two) times daily.) 180 capsule 3   lisinopril   (PRINIVIL ,ZESTRIL ) 20 MG tablet Take 20 mg by mouth every evening.     LORazepam  (ATIVAN ) 1 MG tablet Take 1 mg by mouth as needed for anxiety.     pantoprazole  (PROTONIX ) 40 MG tablet Take 40 mg by mouth 2 (two) times daily. (Patient taking differently: Take 40 mg by mouth daily.)     sertraline  (ZOLOFT ) 100 MG tablet Take 200 mg by mouth daily.     traZODone  (DESYREL ) 50 MG tablet Take 50 mg by mouth at bedtime as needed for sleep. (Patient taking differently: Take 100 mg by mouth at bedtime as needed for sleep.)     No current facility-administered medications for this visit.    Subjective:  Patient arrived on time for today's session.  Patient shared recent progress and experiences.  He stated that he and his wife had gotten along pretty well over the last 3 to 4 weeks however, he stated today he had a minor argument.  He stated that it related to his petting there dogs as they have 3.  He stated that she noticed this and stated to him that she feels that he gives the pets more tension than her.  He stated that she is made this comment somewhat regularly whenever she sees them pet the dogs.  He stated that he told her that he does not understand, how he lets her know  daily that he loves her, makes time to hug and express his feelings in this way to her at different times throughout the day.  He stated that she will continue to often bring up issues from the past, such as the first few years they were married and he was working a lot as they moved out of town away from family.  He stated she continues to blame him for how difficult it was for her at that time.  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  Facilitated his identifying what he needs from the relationship and for himself  independently.  He stated that he often needs his own independent time but this is rarely, if ever given by her as he stated she often has to be with him at all times even at home.  As an example, he stated that he cannot work in the garage on a project without her questioning why he did not tell her to come with him so they could share the time together.  Ways to express himself was explored, he plans to follow through and communicating these needs and feelings.    Interventions: Motivational interviewing, CBT, supportive therapy   Diagnoses:  No diagnosis found.      Plan: Patient is to use coping skills to help manage decrease symptoms.  Utilize his support system.  Long-term goal: Reduce stress, anxiety and sad feelings related to his marital relationship.  Short-term goal: Identify and process feelings related to challenging events in his marriage that contribute to his distressful feelings.                   Identify effective ways to communicate in his marital relationship where his wife feels understood and supported.   Assessment of progress:  progressing    Lonni Fischer, [facility]

## 2024-04-26 DIAGNOSIS — G4700 Insomnia, unspecified: Secondary | ICD-10-CM | POA: Diagnosis not present

## 2024-04-26 DIAGNOSIS — E7800 Pure hypercholesterolemia, unspecified: Secondary | ICD-10-CM | POA: Diagnosis not present

## 2024-04-26 DIAGNOSIS — G3184 Mild cognitive impairment, so stated: Secondary | ICD-10-CM | POA: Diagnosis not present

## 2024-04-26 DIAGNOSIS — Z23 Encounter for immunization: Secondary | ICD-10-CM | POA: Diagnosis not present

## 2024-04-26 DIAGNOSIS — I10 Essential (primary) hypertension: Secondary | ICD-10-CM | POA: Diagnosis not present

## 2024-04-26 DIAGNOSIS — G629 Polyneuropathy, unspecified: Secondary | ICD-10-CM | POA: Diagnosis not present

## 2024-04-26 DIAGNOSIS — K219 Gastro-esophageal reflux disease without esophagitis: Secondary | ICD-10-CM | POA: Diagnosis not present

## 2024-04-28 DIAGNOSIS — M65352 Trigger finger, left little finger: Secondary | ICD-10-CM | POA: Diagnosis not present

## 2024-05-04 ENCOUNTER — Other Ambulatory Visit: Payer: Self-pay | Admitting: Cardiology

## 2024-05-24 ENCOUNTER — Ambulatory Visit: Admitting: Mental Health

## 2024-06-03 ENCOUNTER — Ambulatory Visit (INDEPENDENT_AMBULATORY_CARE_PROVIDER_SITE_OTHER): Admitting: Otolaryngology

## 2024-06-03 ENCOUNTER — Encounter (INDEPENDENT_AMBULATORY_CARE_PROVIDER_SITE_OTHER): Payer: Self-pay | Admitting: Otolaryngology

## 2024-06-03 VITALS — BP 124/82 | HR 81 | Temp 98.1°F | Ht 72.0 in | Wt 200.0 lb

## 2024-06-03 DIAGNOSIS — R040 Epistaxis: Secondary | ICD-10-CM | POA: Diagnosis not present

## 2024-06-03 NOTE — Progress Notes (Signed)
 CC: Recurrent right epistaxis  Discussed the use of AI scribe software for clinical note transcription with the patient, who gave verbal consent to proceed.  History of Present Illness Frederick Watkins is an 78 year old male who presents today complaining of recurrent right epistaxis.  He has been experiencing recurrent bleeding and crusting in the right nostril for the past eight months. The cycle involves slow bleeding, crust formation, and recurrence of bleeding when the crust detaches. He describes occasional stinging sensations in the affected area.  He attempted self-management by drying the area, which temporarily alleviated the bleeding for a few days, but symptoms recurred. The most recent episode of bleeding occurred three days ago.  No history of nasal or sinus surgery, and no known allergies or sinus infections. He can breathe through his nose without difficulty.  He is not on any blood thinners, having discontinued aspirin last year following a subdural hematoma. He has been using hearing aids for the past few years and is in the process of obtaining new ones after losing his previous pair.   Past Medical History:  Diagnosis Date   Coronary artery calcification 12/06/2018   Coronary artery disease involving native coronary artery of native heart without angina pectoris 12/06/2018   Essential hypertension 12/06/2018   GERD (gastroesophageal reflux disease)    H/O hernia repair    Headache    History of cholecystectomy    Lacunar infarct    Left basal ganglia   Mixed hyperlipidemia 12/06/2018   Mucous cyst of finger    Left thumb   Right bundle branch block 12/06/2018   RLS (restless legs syndrome)     Past Surgical History:  Procedure Laterality Date   CHOLECYSTECTOMY  1999   EAR CYST EXCISION  05/31/2012   Procedure: CYST REMOVAL;  Surgeon: Alm DELENA Hummer, MD;  Location: [facility];  Service: Orthopedics;  Laterality: Left;  Left Thumb Mucous  Cyst Excision   HERNIA REPAIR  1994   IR ANGIO EXTERNAL CAROTID SEL EXT CAROTID UNI L MOD SED  05/12/2023   IR ANGIO INTRA EXTRACRAN SEL INTERNAL CAROTID UNI L MOD SED  05/12/2023   IR ANGIOGRAM FOLLOW UP STUDY  05/12/2023   IR NEURO EACH ADD'L AFTER BASIC UNI LEFT (MS)  05/12/2023   IR TRANSCATH/EMBOLIZ  05/12/2023   RADIOLOGY WITH ANESTHESIA N/A 05/12/2023   Procedure: IR WITH ANESTHESIA;  Surgeon: Lanis Pupa, MD;  Location: [facility];  Service: Radiology;  Laterality: N/A;   RETINAL DETACHMENT REPAIR W/ SCLERAL BUCKLE LE  03/07/2012   W/ CATARACT EXTRACTION WITH LENS IMPLANT , RIGHT EYE    Family History  Problem Relation Age of Onset   Dementia Mother        Unspecified type    Social History:  reports that he has never smoked. He has never used smokeless tobacco. He reports current alcohol use of about 7.0 standard drinks of alcohol per week. He reports that he does not use drugs.  Allergies:  Allergies  Allergen Reactions   Crestor [Rosuvastatin] Swelling    TONGUE SWELLS    Prior to Admission medications   Medication Sig Start Date End Date Taking? Authorizing Provider  Ascorbic Acid  (VITAMIN C ) 1000 MG tablet Take 1,000 mg by mouth daily.   Yes [provider]  atorvastatin  (LIPITOR) 40 MG tablet Take 40 mg by mouth daily.   Yes [provider]  ezetimibe  (ZETIA ) 10 MG tablet TAKE 1 TABLET BY MOUTH DAILY 05/05/24  Yes Jeffrie Oneil BROCKS, MD  gabapentin  (NEURONTIN ) 100 MG capsule Take 2 capsules every night Patient taking differently: Take 100 mg by mouth 2 (two) times daily. 10/08/20  Yes Georjean Darice HERO, MD  lisinopril  (PRINIVIL ,ZESTRIL ) 20 MG tablet Take 20 mg by mouth every evening.   Yes [provider]  LORazepam  (ATIVAN ) 1 MG tablet Take 1 mg by mouth as needed for anxiety. 03/22/20  Yes [provider]  pantoprazole  (PROTONIX ) 40 MG tablet Take 40 mg by mouth 2 (two) times daily. Patient taking differently: Take 40 mg by mouth daily.  12/09/19  Yes [provider]  sertraline  (ZOLOFT ) 100 MG tablet Take 200 mg by mouth daily.   Yes [provider]  traZODone  (DESYREL ) 50 MG tablet Take 50 mg by mouth at bedtime as needed for sleep. Patient taking differently: Take 100 mg by mouth at bedtime as needed for sleep.   Yes [provider]    Blood pressure 124/82, pulse 81, temperature 98.1 F (36.7 C), height 6' (1.829 m), weight 200 lb (90.7 kg), SpO2 92%. Exam: General: Communicates without difficulty, well nourished, no acute distress. Head: Normocephalic, no evidence injury, no tenderness, facial buttresses intact without stepoff. Face/sinus: No tenderness to palpation and percussion. Facial movement is normal and symmetric. Eyes: PERRL, EOMI. No scleral icterus, conjunctivae clear. Neuro: CN II exam reveals vision grossly intact.  No nystagmus at any point of gaze. Ears: Auricles well formed without lesions.  Ear canals are intact without mass or lesion.  No erythema or edema is appreciated.  The TMs are intact without fluid. Nose: External evaluation reveals normal support and skin without lesions.  Dorsum is intact.  Anterior rhinoscopy reveals dried blood clots and hypervascular areas on the right nasal septum.  Oral:  Oral cavity and oropharynx are intact, symmetric, without erythema or edema.  Mucosa is moist without lesions. Neck: Full range of motion without pain.  There is no significant lymphadenopathy.  No masses palpable.  Thyroid  bed within normal limits to palpation.  Parotid glands and submandibular glands equal bilaterally without mass.  Trachea is midline. Neuro:  CN 2-12 grossly intact.   Procedure:  Endoscopic control of recurrent right epistaxis. Indication:  Recurrent epistaxis  Description:  The right nasal cavity is sprayed with topical xylocaine  and neo-synephrine.  After adequate anesthesia is achieved, the nasal cavity is examined with a 0 rigid endoscope.  A suction catheter is  inserted in parallel with the 0 endoscope, and it is used to suction blood clots from the nasal cavity.  Several hypervascular areas are noted on the anterior and superior portion of the septum. Active bleeding is noted. A silver nitrate stick is inserted in parallel with the 0 endoscope.  It is used to repeatedly cauterized the hypervascular areas.  Good hemostasis is achieved.  The patient tolerated the procedure well.    Assessment and Plan Assessment & Plan Recurrent right epistaxis Approximately eight months, with episodes of bleeding, crusting, and stinging. No history of nasal or sinus surgery, allergies, or sinus infections. No current use of anticoagulants. Examination revealed bleeding from the anterior and superior portions of the right nasal septum. - Performed silver nitrate cauterization to seal off blood vessels. - Instructed on nasal pinching technique to manage potential bleeding. - Recommended use of a humidifier during winter months to reduce dryness. - Scheduled follow-up appointment in six weeks to assess healing and address any residual issues.   Arriyana Rodell W Erikson Danzy 06/03/2024, 11:47 AM

## 2024-06-04 DIAGNOSIS — R040 Epistaxis: Secondary | ICD-10-CM | POA: Insufficient documentation

## 2024-06-06 ENCOUNTER — Telehealth (INDEPENDENT_AMBULATORY_CARE_PROVIDER_SITE_OTHER): Payer: Self-pay | Admitting: Otolaryngology

## 2024-06-06 NOTE — Telephone Encounter (Signed)
 The patient called in concerning his follow up appointment with Dr Karis, it needs to have been scheduled for 6 weeks and not 6 months, I left a voicemail asking for him to call back so we can move his appointment up.

## 2024-06-06 NOTE — Telephone Encounter (Signed)
 Returned patient 's phone call. Left him a voice mail.

## 2024-06-08 ENCOUNTER — Telehealth (INDEPENDENT_AMBULATORY_CARE_PROVIDER_SITE_OTHER): Payer: Self-pay | Admitting: Otolaryngology

## 2024-06-08 NOTE — Telephone Encounter (Signed)
 Patient called and stated he would like a call back with instructions on what he should be doing now and in the future to prevent his Right Nostril from bleeding again.  He had a procedure on 06/03/2024 on his Right nostril for bleeding and wants to know if this procedure is going to take care of the problem permanently. He can be reached at [phone].

## 2024-06-08 NOTE — Telephone Encounter (Signed)
 Patient left VM. Try to call him  back several times. Phone goes straight to voice mail . Left message .

## 2024-06-09 NOTE — Telephone Encounter (Signed)
 Returned patient phone call. Phone goes  straight to voice mail with out ringing. Left another message to call back.

## 2024-06-09 NOTE — Telephone Encounter (Signed)
 Called patient phone went straight to voicemail. I left a voice mail to give us  a call back and the call back number.

## 2024-06-15 ENCOUNTER — Ambulatory Visit (INDEPENDENT_AMBULATORY_CARE_PROVIDER_SITE_OTHER): Admitting: Otolaryngology

## 2024-06-15 ENCOUNTER — Encounter (INDEPENDENT_AMBULATORY_CARE_PROVIDER_SITE_OTHER): Payer: Self-pay | Admitting: Otolaryngology

## 2024-06-15 VITALS — BP 120/71 | HR 82 | Temp 97.8°F | Ht 72.0 in | Wt 200.0 lb

## 2024-06-15 DIAGNOSIS — R040 Epistaxis: Secondary | ICD-10-CM | POA: Diagnosis not present

## 2024-06-15 MED ORDER — AZITHROMYCIN 250 MG PO TABS: ORAL_TABLET | ORAL | 0 refills | Status: AC

## 2024-06-15 NOTE — Progress Notes (Signed)
 Patient ID: Frederick Watkins, male   DOB: 02-07-1946, 78 y.o.   MRN: [id number]  Procedure:  Endoscopic control of recurrent right epistaxis  Indication: The patient is an 78 year old male who returns today for his follow-up evaluation.  He was last seen in November 2025.  At that time, he was complaining of recurrent right epistaxis.  He was treated with endoscopic cauterization of the right nasal septum.  The patient returns today complaining of 3 additional episodes of right-sided bleeding.  Some of the bleedings were severe.  He also complains of recurrent sinusitis.  He has been having frequent facial pain and nasal drainage.  Description:  The right nasal cavity is sprayed with topical xylocaine  and neo-synephrine.  After adequate anesthesia is achieved, the nasal cavity is examined with a 0 rigid endoscope.  A suction catheter is inserted in parallel with the 0 endoscope, and it is used to suction blood clots from the right nasal cavity.  Hypervascular areas are noted at the anterior and superior aspect of the nasal septum.  A silver nitrate stick is inserted in parallel with the 0 endoscope.  It is used to repeatedly cauterized the hypervascular areas.  Good hemostasis is achieved.  The patient tolerated the procedure well.  Assessment: 1.  Hypervascular areas are noted on the right anterior and superior nasal septum.   2.  Recurrent sinusitis  Plan: 1. Endoscopic cauterization of the right anterior and superior nasal septum. 2. The nasal endoscopy findings are reviewed with the patient. 3. Nasal ointment/humidifier to treat the nasal dryness. 4.  Z-Pak for 5 days. 5.  The patient will return for re-evaluation in 1 month.

## 2024-07-25 ENCOUNTER — Ambulatory Visit (INDEPENDENT_AMBULATORY_CARE_PROVIDER_SITE_OTHER): Admitting: Otolaryngology

## 2024-07-25 ENCOUNTER — Encounter (INDEPENDENT_AMBULATORY_CARE_PROVIDER_SITE_OTHER): Payer: Self-pay | Admitting: Otolaryngology

## 2024-07-25 VITALS — BP 126/75 | HR 78 | Ht 72.0 in | Wt 206.0 lb

## 2024-07-25 DIAGNOSIS — R040 Epistaxis: Secondary | ICD-10-CM

## 2024-07-25 NOTE — Progress Notes (Signed)
 Patient ID: Frederick Watkins, male   DOB: Dec 10, 1945, 79 y.o.   MRN: [id number]  Follow up: Recurrent epistaxis, recurrent sinusitis  History of Present Illness Frederick Watkins is an 79 year old male with recurrent epistaxis who presents for otolaryngology follow-up after prior nasal cauterization.  He has not experienced further significant epistaxis since his last visit. Over the past month, his nasal symptoms have remained stable and he describes his nose as good.  He occasionally notes a small, red, crusted lesion inside the nose, which he manages with topical petrolatum. He continues to use a humidifier at home for nasal moisture. He denies pain and reports no new or worsening symptoms.  He completed a course of antibiotics for sinusitis as previously prescribed, with good response and no current symptoms of sinus infection.  Exam: General: Communicates without difficulty, well nourished, no acute distress. Head: Normocephalic, no evidence injury, no tenderness, facial buttresses intact without stepoff. Face/sinus: No tenderness to palpation and percussion. Facial movement is normal and symmetric. Eyes: PERRL, EOMI. No scleral icterus, conjunctivae clear. Neuro: CN II exam reveals vision grossly intact.  No nystagmus at any point of gaze. Ears: Auricles well formed without lesions.  Ear canals are intact without mass or lesion.  No erythema or edema is appreciated.  The TMs are intact without fluid. Nose: External evaluation reveals normal support and skin without lesions.  Dorsum is intact.  Anterior rhinoscopy reveals congested mucosa over anterior aspect of inferior turbinates and intact septum.  No purulence noted. Oral:  Oral cavity and oropharynx are intact, symmetric, without erythema or edema.  Mucosa is moist without lesions. Neck: Full range of motion without pain.  There is no significant lymphadenopathy.  No masses palpable.  Thyroid  bed within normal limits to palpation.  Parotid  glands and submandibular glands equal bilaterally without mass.  Trachea is midline. Neuro:  CN 2-12 grossly intact.    Assessment and Plan Assessment & Plan Recurrent epistaxis Recurrent epistaxis previously managed with endoscopic cauterization, currently well-controlled with no significant episodes since the last visit. Minor crusting and erythema are present, likely secondary to mucosal dryness and vascular changes. Previously cauterized vessels have resolved, with only small vessels remaining. - Reinforced use of nasal ointment (Vaseline) to maintain mucosal moisture and prevent recurrence. - Advised continued use of humidifier to reduce nasal dryness. - Instructed to return if increased or recurrent epistaxis occurs.  Recurrent sinusitis Recurrent sinusitis, recently completed antibiotic therapy with good response. No current symptoms or evidence of acute infection. - Confirmed completion of antibiotic course. - Advised to contact the office if symptoms of sinus infection or allergy recur.

## 2024-12-02 ENCOUNTER — Ambulatory Visit (INDEPENDENT_AMBULATORY_CARE_PROVIDER_SITE_OTHER): Admitting: Otolaryngology
